# Patient Record
Sex: Male | Born: 1975 | Race: Black or African American | Hispanic: No | Marital: Single | Smoking: Former smoker
Health system: Southern US, Community
[De-identification: ages and names within clinical notes are randomized; demographics above are authoritative.]

---

## 2001-01-01 ENCOUNTER — Emergency Department (HOSPITAL_COMMUNITY): Admission: EM | Admit: 2001-01-01 | Discharge: 2001-01-01 | Payer: Self-pay | Admitting: Emergency Medicine

## 2001-09-05 ENCOUNTER — Emergency Department (HOSPITAL_COMMUNITY): Admission: EM | Admit: 2001-09-05 | Discharge: 2001-09-05 | Payer: Self-pay | Admitting: *Deleted

## 2001-09-27 ENCOUNTER — Emergency Department (HOSPITAL_COMMUNITY): Admission: EM | Admit: 2001-09-27 | Discharge: 2001-09-27 | Payer: Self-pay

## 2002-02-20 ENCOUNTER — Emergency Department (HOSPITAL_COMMUNITY): Admission: EM | Admit: 2002-02-20 | Discharge: 2002-02-20 | Payer: Self-pay | Admitting: Emergency Medicine

## 2002-02-23 ENCOUNTER — Emergency Department (HOSPITAL_COMMUNITY): Admission: EM | Admit: 2002-02-23 | Discharge: 2002-02-23 | Payer: Self-pay | Admitting: Emergency Medicine

## 2002-09-24 ENCOUNTER — Encounter: Payer: Self-pay | Admitting: Emergency Medicine

## 2002-09-24 ENCOUNTER — Emergency Department (HOSPITAL_COMMUNITY): Admission: EM | Admit: 2002-09-24 | Discharge: 2002-09-24 | Payer: Self-pay | Admitting: Emergency Medicine

## 2003-04-09 ENCOUNTER — Encounter: Payer: Self-pay | Admitting: Emergency Medicine

## 2003-04-09 ENCOUNTER — Emergency Department (HOSPITAL_COMMUNITY): Admission: EM | Admit: 2003-04-09 | Discharge: 2003-04-09 | Payer: Self-pay | Admitting: Emergency Medicine

## 2003-04-11 ENCOUNTER — Emergency Department (HOSPITAL_COMMUNITY): Admission: EM | Admit: 2003-04-11 | Discharge: 2003-04-11 | Payer: Self-pay | Admitting: Emergency Medicine

## 2003-07-10 ENCOUNTER — Emergency Department (HOSPITAL_COMMUNITY): Admission: EM | Admit: 2003-07-10 | Discharge: 2003-07-10 | Payer: Self-pay | Admitting: Emergency Medicine

## 2003-07-10 ENCOUNTER — Encounter: Payer: Self-pay | Admitting: Emergency Medicine

## 2003-11-15 ENCOUNTER — Emergency Department (HOSPITAL_COMMUNITY): Admission: EM | Admit: 2003-11-15 | Discharge: 2003-11-15 | Payer: Self-pay | Admitting: Emergency Medicine

## 2004-05-25 ENCOUNTER — Emergency Department (HOSPITAL_COMMUNITY): Admission: EM | Admit: 2004-05-25 | Discharge: 2004-05-25 | Payer: Self-pay | Admitting: Emergency Medicine

## 2004-07-06 ENCOUNTER — Emergency Department (HOSPITAL_COMMUNITY): Admission: EM | Admit: 2004-07-06 | Discharge: 2004-07-06 | Payer: Self-pay | Admitting: Emergency Medicine

## 2004-12-03 ENCOUNTER — Emergency Department (HOSPITAL_COMMUNITY): Admission: EM | Admit: 2004-12-03 | Discharge: 2004-12-03 | Payer: Self-pay | Admitting: Emergency Medicine

## 2004-12-16 ENCOUNTER — Emergency Department (HOSPITAL_COMMUNITY): Admission: EM | Admit: 2004-12-16 | Discharge: 2004-12-16 | Payer: Self-pay | Admitting: Emergency Medicine

## 2005-07-26 ENCOUNTER — Emergency Department (HOSPITAL_COMMUNITY): Admission: EM | Admit: 2005-07-26 | Discharge: 2005-07-26 | Payer: Self-pay | Admitting: Emergency Medicine

## 2005-08-20 ENCOUNTER — Emergency Department (HOSPITAL_COMMUNITY): Admission: EM | Admit: 2005-08-20 | Discharge: 2005-08-20 | Payer: Self-pay | Admitting: Emergency Medicine

## 2005-10-25 ENCOUNTER — Emergency Department (HOSPITAL_COMMUNITY): Admission: EM | Admit: 2005-10-25 | Discharge: 2005-10-25 | Payer: Self-pay | Admitting: Emergency Medicine

## 2005-11-10 ENCOUNTER — Emergency Department (HOSPITAL_COMMUNITY): Admission: EM | Admit: 2005-11-10 | Discharge: 2005-11-10 | Payer: Self-pay | Admitting: *Deleted

## 2005-12-07 ENCOUNTER — Emergency Department (HOSPITAL_COMMUNITY): Admission: EM | Admit: 2005-12-07 | Discharge: 2005-12-07 | Payer: Self-pay | Admitting: Emergency Medicine

## 2005-12-09 ENCOUNTER — Emergency Department (HOSPITAL_COMMUNITY): Admission: EM | Admit: 2005-12-09 | Discharge: 2005-12-09 | Payer: Self-pay | Admitting: Emergency Medicine

## 2005-12-14 ENCOUNTER — Emergency Department (HOSPITAL_COMMUNITY): Admission: EM | Admit: 2005-12-14 | Discharge: 2005-12-14 | Payer: Self-pay | Admitting: Emergency Medicine

## 2005-12-17 ENCOUNTER — Emergency Department (HOSPITAL_COMMUNITY): Admission: EM | Admit: 2005-12-17 | Discharge: 2005-12-17 | Payer: Self-pay | Admitting: Emergency Medicine

## 2006-01-05 ENCOUNTER — Emergency Department (HOSPITAL_COMMUNITY): Admission: EM | Admit: 2006-01-05 | Discharge: 2006-01-05 | Payer: Self-pay | Admitting: Emergency Medicine

## 2006-01-24 ENCOUNTER — Emergency Department (HOSPITAL_COMMUNITY): Admission: EM | Admit: 2006-01-24 | Discharge: 2006-01-24 | Payer: Self-pay | Admitting: Emergency Medicine

## 2006-02-14 ENCOUNTER — Emergency Department (HOSPITAL_COMMUNITY): Admission: EM | Admit: 2006-02-14 | Discharge: 2006-02-14 | Payer: Self-pay | Admitting: *Deleted

## 2006-02-28 ENCOUNTER — Emergency Department (HOSPITAL_COMMUNITY): Admission: EM | Admit: 2006-02-28 | Discharge: 2006-02-28 | Payer: Self-pay | Admitting: Emergency Medicine

## 2006-12-23 ENCOUNTER — Emergency Department (HOSPITAL_COMMUNITY): Admission: EM | Admit: 2006-12-23 | Discharge: 2006-12-23 | Payer: Self-pay | Admitting: Emergency Medicine

## 2007-11-20 ENCOUNTER — Emergency Department (HOSPITAL_COMMUNITY): Admission: EM | Admit: 2007-11-20 | Discharge: 2007-11-20 | Payer: Self-pay | Admitting: Emergency Medicine

## 2007-11-21 ENCOUNTER — Emergency Department (HOSPITAL_COMMUNITY): Admission: EM | Admit: 2007-11-21 | Discharge: 2007-11-21 | Payer: Self-pay | Admitting: Emergency Medicine

## 2008-09-26 ENCOUNTER — Emergency Department (HOSPITAL_BASED_OUTPATIENT_CLINIC_OR_DEPARTMENT_OTHER): Admission: EM | Admit: 2008-09-26 | Discharge: 2008-09-26 | Payer: Self-pay | Admitting: Emergency Medicine

## 2010-09-20 ENCOUNTER — Emergency Department (HOSPITAL_BASED_OUTPATIENT_CLINIC_OR_DEPARTMENT_OTHER): Admission: EM | Admit: 2010-09-20 | Discharge: 2010-09-21 | Payer: Self-pay | Admitting: Emergency Medicine

## 2011-11-30 ENCOUNTER — Encounter: Payer: Self-pay | Admitting: *Deleted

## 2011-11-30 ENCOUNTER — Emergency Department (HOSPITAL_BASED_OUTPATIENT_CLINIC_OR_DEPARTMENT_OTHER)
Admission: EM | Admit: 2011-11-30 | Discharge: 2011-11-30 | Disposition: A | Discharge status: Home / Self Care | Payer: PRIVATE HEALTH INSURANCE | Attending: Emergency Medicine | Admitting: Emergency Medicine

## 2011-11-30 DIAGNOSIS — Z79899 Other long term (current) drug therapy: Secondary | ICD-10-CM | POA: Insufficient documentation

## 2011-11-30 DIAGNOSIS — K5289 Other specified noninfective gastroenteritis and colitis: Secondary | ICD-10-CM | POA: Insufficient documentation

## 2011-11-30 DIAGNOSIS — K529 Noninfective gastroenteritis and colitis, unspecified: Secondary | ICD-10-CM

## 2011-11-30 DIAGNOSIS — R1110 Vomiting, unspecified: Secondary | ICD-10-CM | POA: Insufficient documentation

## 2011-11-30 MED ORDER — PROMETHAZINE HCL 25 MG PO TABS: 25.0000 mg | ORAL_TABLET | Freq: Four times a day (QID) | ORAL | Status: AC | PRN

## 2011-11-30 NOTE — ED Notes (Signed)
Fever, chills, body aches and vomiting since Monday.

## 2011-11-30 NOTE — ED Provider Notes (Signed)
History     CSN: [id number] Arrival date & time: 11/30/2011  9:23 PM   First MD Initiated Contact with Patient 11/30/11 2156      Chief Complaint  Patient presents with  . Influenza  . Emesis    (Consider location/radiation/quality/duration/timing/severity/associated sxs/prior treatment) HPI Comments: Started yesterday feeling badly.  Vomited twice.  Today having fevers, chills, and sweats.  No diarrhea.  No cough.  Patient is a 35 y.o. male presenting with flu symptoms and vomiting. The history is provided by the patient.  Influenza This is a new problem. The current episode started yesterday. The problem occurs constantly. The problem has been gradually worsening. Pertinent negatives include no chest pain, no abdominal pain, no headaches and no shortness of breath. The symptoms are aggravated by nothing. The symptoms are relieved by nothing.  Emesis  Pertinent negatives include no abdominal pain and no headaches.    History reviewed. No pertinent past medical history.  History reviewed. No pertinent past surgical history.  History reviewed. No pertinent family history.  History  Substance Use Topics  . Smoking status: Never Smoker   . Smokeless tobacco: Not on file  . Alcohol Use: No      Review of Systems  Respiratory: Negative for shortness of breath.   Cardiovascular: Negative for chest pain.  Gastrointestinal: Positive for vomiting. Negative for abdominal pain.  Neurological: Negative for headaches.  All other systems reviewed and are negative.    Allergies  Review of patient's allergies indicates no known allergies.  Home Medications   Current Outpatient Rx  Name Route Sig Dispense Refill  . BISMUTH SUBSALICYLATE 262 MG PO CHEW Oral Chew 524 mg by mouth as needed. For indigestion     . IBUPROFEN 800 MG PO TABS Oral Take 800 mg by mouth every 8 (eight) hours as needed. For pain     . ONE-DAILY MULTI VITAMINS PO TABS Oral Take 1 tablet by mouth daily.       Marland Kitchen NAPHAZOLINE HCL 0.012 % OP SOLN Both Eyes Place 2-3 drops into both eyes daily as needed. For dry eyes     . OVER THE COUNTER MEDICATION Oral Take 1 packet by mouth daily. Emergen-C powder     . PHENYLEPH-CPM-DM-APAP 04-13-09-325 MG PO CAPS Oral Take 2 tablets by mouth every 6 (six) hours as needed. For congestion       BP 173/95  Pulse 101  Temp(Src) 100.3 F (37.9 C) (Oral)  Resp 18  Ht 6\' 6"  (1.981 m)  Wt 230 lb (104.327 kg)  BMI 26.58 kg/m2  SpO2 97%  Physical Exam  Nursing note and vitals reviewed. Constitutional: He is oriented to person, place, and time. He appears well-developed and well-nourished.  HENT:  Head: Normocephalic and atraumatic.  Right Ear: External ear normal.  Left Ear: External ear normal.  Mouth/Throat: Oropharynx is clear and moist.  Neck: Normal range of motion. Neck supple.  Cardiovascular: Normal rate and regular rhythm.   No murmur heard. Pulmonary/Chest: Effort normal and breath sounds normal. No respiratory distress. He has no wheezes.  Abdominal: Soft. Bowel sounds are normal. He exhibits no distension. There is no tenderness.  Musculoskeletal: Normal range of motion. He exhibits no edema.  Lymphadenopathy:    He has no cervical adenopathy.  Neurological: He is alert and oriented to person, place, and time.  Skin: Skin is warm and dry.    ED Course  Procedures (including critical care time)  Labs Reviewed - No data to display No results  found.   No diagnosis found.    MDM  Vomiting, fever, likely viral etiology.  Will recommend tylenol, motrin, phenergan.        Geoffery Lyons, MD 11/30/11 2207

## 2011-12-02 ENCOUNTER — Emergency Department (HOSPITAL_BASED_OUTPATIENT_CLINIC_OR_DEPARTMENT_OTHER)
Admission: EM | Admit: 2011-12-02 | Discharge: 2011-12-02 | Disposition: A | Discharge status: Home / Self Care | Payer: PRIVATE HEALTH INSURANCE | Attending: Emergency Medicine | Admitting: Emergency Medicine

## 2011-12-02 ENCOUNTER — Encounter (HOSPITAL_BASED_OUTPATIENT_CLINIC_OR_DEPARTMENT_OTHER): Payer: Self-pay | Admitting: *Deleted

## 2011-12-02 DIAGNOSIS — R05 Cough: Secondary | ICD-10-CM | POA: Insufficient documentation

## 2011-12-02 DIAGNOSIS — B349 Viral infection, unspecified: Secondary | ICD-10-CM

## 2011-12-02 DIAGNOSIS — B9789 Other viral agents as the cause of diseases classified elsewhere: Secondary | ICD-10-CM | POA: Insufficient documentation

## 2011-12-02 DIAGNOSIS — R059 Cough, unspecified: Secondary | ICD-10-CM | POA: Insufficient documentation

## 2011-12-02 NOTE — ED Provider Notes (Signed)
History     CSN: [id number]  Arrival date & time 12/02/11  4098   First MD Initiated Contact with Patient 12/02/11 [phone]      Chief Complaint  Patient presents with  . Cough  . Nasal Congestion    (Consider location/radiation/quality/duration/timing/severity/associated sxs/prior treatment) HPI Patient with cough, fever, nausea and vomiting which began on Sunday with fever to 103.  Patient seen here on Monday and told he should stay out of work until today.  Patient states he still feels sick although fever vomiting have resolved  But congested and continues with productive cough.  Patient taking in fluids well.  No sob or lightheaded.  Patient does not smoke, drink, or take illicit drugs.   History reviewed. No pertinent past medical history.  History reviewed. No pertinent past surgical history.  No family history on file.  History  Substance Use Topics  . Smoking status: Never Smoker   . Smokeless tobacco: Not on file  . Alcohol Use: No      Review of Systems  All other systems reviewed and are negative.    Allergies  Review of patient's allergies indicates no known allergies.  Home Medications   Current Outpatient Rx  Name Route Sig Dispense Refill  . BISMUTH SUBSALICYLATE 262 MG PO CHEW Oral Chew 524 mg by mouth as needed. For indigestion     . IBUPROFEN 800 MG PO TABS Oral Take 800 mg by mouth every 8 (eight) hours as needed. For pain     . ONE-DAILY MULTI VITAMINS PO TABS Oral Take 1 tablet by mouth daily.      Marland Kitchen NAPHAZOLINE HCL 0.012 % OP SOLN Both Eyes Place 2-3 drops into both eyes daily as needed. For dry eyes     . OVER THE COUNTER MEDICATION Oral Take 1 packet by mouth daily. Emergen-C powder     . PHENYLEPH-CPM-DM-APAP 04-13-09-325 MG PO CAPS Oral Take 2 tablets by mouth every 6 (six) hours as needed. For congestion     . PROMETHAZINE HCL 25 MG PO TABS Oral Take 1 tablet (25 mg total) by mouth every 6 (six) hours as needed for nausea. 10 tablet 0    BP  138/81  Pulse 78  Temp(Src) 98.4 F (36.9 C) (Oral)  Resp 18  SpO2 100%  Physical Exam  Vitals reviewed. Constitutional: He is oriented to person, place, and time. He appears well-developed and well-nourished.  HENT:  Head: Normocephalic and atraumatic.  Right Ear: External ear normal.  Left Ear: External ear normal.  Nose: Nose normal.  Mouth/Throat: Oropharynx is clear and moist.  Eyes: Conjunctivae and EOM are normal. Pupils are equal, round, and reactive to light.  Neck: Normal range of motion. Neck supple.  Cardiovascular: Normal rate, regular rhythm, normal heart sounds and intact distal pulses.   Pulmonary/Chest: Effort normal and breath sounds normal.  Abdominal: Soft. Bowel sounds are normal.  Musculoskeletal: Normal range of motion.  Neurological: He is alert and oriented to person, place, and time. He has normal reflexes.  Skin: Skin is warm and dry.  Psychiatric: He has a normal mood and affect. His behavior is normal. Judgment and thought content normal.    ED Course  Procedures (including critical care time)  Labs Reviewed - No data to display No results found.   No diagnosis found.    Patient with improving viral syndrome symptoms but requests note to return tomorrow instead of today.  Patient counseled regarding expectations of time frame for recovery and to  return if worse.       Hilario Quarry, MD 12/02/11 [phone]

## 2011-12-02 NOTE — ED Notes (Signed)
Pt refuses gown

## 2011-12-02 NOTE — ED Notes (Signed)
Pt amb to room 5 with quick steady gait in nad. Pt reports ongoing cough and congestion x 2 days. Pt seen here on 12/18 for same.

## 2012-02-01 ENCOUNTER — Encounter (HOSPITAL_BASED_OUTPATIENT_CLINIC_OR_DEPARTMENT_OTHER): Payer: Self-pay | Admitting: *Deleted

## 2012-02-01 ENCOUNTER — Emergency Department (HOSPITAL_BASED_OUTPATIENT_CLINIC_OR_DEPARTMENT_OTHER)
Admission: EM | Admit: 2012-02-01 | Discharge: 2012-02-01 | Disposition: A | Discharge status: Home Health | Payer: PRIVATE HEALTH INSURANCE | Attending: Emergency Medicine | Admitting: Emergency Medicine

## 2012-02-01 DIAGNOSIS — R1110 Vomiting, unspecified: Secondary | ICD-10-CM | POA: Insufficient documentation

## 2012-02-01 DIAGNOSIS — J069 Acute upper respiratory infection, unspecified: Secondary | ICD-10-CM | POA: Insufficient documentation

## 2012-02-01 MED ORDER — SODIUM CHLORIDE 0.9 % IV BOLUS (SEPSIS)
1000.0000 mL | Freq: Once | INTRAVENOUS | Status: AC
  Administered 2012-02-01: 1000 mL via INTRAVENOUS

## 2012-02-01 MED ORDER — ONDANSETRON HCL 4 MG/2ML IJ SOLN
4.0000 mg | Freq: Once | INTRAMUSCULAR | Status: AC
  Administered 2012-02-01: 4 mg via INTRAVENOUS
  Filled 2012-02-01: qty 2

## 2012-02-01 NOTE — ED Notes (Signed)
States he feels better. Needs a work note before he leaves

## 2012-02-01 NOTE — ED Notes (Signed)
Chills, vomiting, sore throat, runny nose and cough.

## 2012-02-01 NOTE — Discharge Instructions (Signed)
Antibiotic Nonuse  Your caregiver felt that the infection or problem was not one that would be helped with an antibiotic. Infections may be caused by viruses or bacteria. Only a caregiver can tell which one of these is the likely cause of an illness. A cold is the most common cause of infection in both adults and children. A cold is a virus. Antibiotic treatment will have no effect on a viral infection. Viruses can lead to many lost days of work caring for sick children and many missed days of school. Children may catch as many as 10 "colds" or "flus" per year during which they can be tearful, cranky, and uncomfortable. The goal of treating a virus is aimed at keeping the ill person comfortable. Antibiotics are medications used to help the body fight bacterial infections. There are relatively few types of bacteria that cause infections but there are hundreds of viruses. While both viruses and bacteria cause infection they are very different types of germs. A viral infection will typically go away by itself within 7 to 10 days. Bacterial infections may spread or get worse without antibiotic treatment. Examples of bacterial infections are:  Sore throats (like strep throat or tonsillitis).     Infection in the lung (pneumonia).     Ear and skin infections.  Examples of viral infections are:  Colds or flus.     Most coughs and bronchitis.     Sore throats not caused by Strep.     Runny noses.  It is often best not to take an antibiotic when a viral infection is the cause of the problem. Antibiotics can kill off the helpful bacteria that we have inside our body and allow harmful bacteria to start growing. Antibiotics can cause side effects such as allergies, nausea, and diarrhea without helping to improve the symptoms of the viral infection. Additionally, repeated uses of antibiotics can cause bacteria inside of our body to become resistant. That resistance can be passed onto harmful bacterial. The  next time you have an infection it may be harder to treat if antibiotics are used when they are not needed. Not treating with antibiotics allows our own immune system to develop and take care of infections more efficiently. Also, antibiotics will work better for us when they are prescribed for bacterial infections. Treatments for a child that is ill may include:  Give extra fluids throughout the day to stay hydrated.     Get plenty of rest.     Only give your child over-the-counter or prescription medicines for pain, discomfort, or fever as directed by your caregiver.     The use of a cool mist humidifier may help stuffy noses.     Cold medications if suggested by your caregiver.  Your caregiver may decide to start you on an antibiotic if:  The problem you were seen for today continues for a longer length of time than expected.     You develop a secondary bacterial infection.  SEEK MEDICAL CARE IF:  Fever lasts longer than 5 days.     Symptoms continue to get worse after 5 to 7 days or become severe.     Difficulty in breathing develops.     Signs of dehydration develop (poor drinking, rare urinating, dark colored urine).     Changes in behavior or worsening tiredness (listlessness or lethargy).  Document Released: 02/07/2002 Document Revised: 08/11/2011 Document Reviewed: 08/06/2009 [employer] Patient Information 2012 [employer]. 

## 2012-02-01 NOTE — ED Provider Notes (Signed)
History     CSN: [id number]  Arrival date & time 02/01/12  1439   First MD Initiated Contact with Patient 02/01/12 1502      Chief Complaint  Patient presents with  . URI    (Consider location/radiation/quality/duration/timing/severity/associated sxs/prior treatment) Patient is a 36 y.o. male presenting with URI. The history is provided by the patient. No language interpreter was used.  URI The primary symptoms include sore throat, cough and vomiting. Primary symptoms do not include fever, nausea or myalgias. The current episode started 2 days ago. This is a new problem.    History reviewed. No pertinent past medical history.  History reviewed. No pertinent past surgical history.  No family history on file.  History  Substance Use Topics  . Smoking status: Never Smoker   . Smokeless tobacco: Not on file  . Alcohol Use: No      Review of Systems  Constitutional: Negative for fever.  HENT: Positive for sore throat.   Respiratory: Positive for cough.   Gastrointestinal: Positive for vomiting. Negative for nausea.  Musculoskeletal: Negative for myalgias.  All other systems reviewed and are negative.    Allergies  Review of patient's allergies indicates no known allergies.  Home Medications   Current Outpatient Rx  Name Route Sig Dispense Refill  . IBUPROFEN 800 MG PO TABS Oral Take 800 mg by mouth every 8 (eight) hours as needed. For pain     . ONE-DAILY MULTI VITAMINS PO TABS Oral Take 1 tablet by mouth daily.      Marland Kitchen NAPHAZOLINE HCL 0.012 % OP SOLN Both Eyes Place 2-3 drops into both eyes daily as needed. For dry eyes       BP 147/55  Pulse 64  Temp(Src) 98.2 F (36.8 C) (Oral)  Resp 18  SpO2 100%  Physical Exam  Nursing note and vitals reviewed. Constitutional: He is oriented to person, place, and time. He appears well-developed and well-nourished.  HENT:  Head: Normocephalic and atraumatic.  Right Ear: External ear normal.  Left Ear: External ear  normal.  Nose: Rhinorrhea present.  Mouth/Throat: Posterior oropharyngeal erythema present.  Eyes: EOM are normal.  Neck: Neck supple.  Cardiovascular: Normal rate and regular rhythm.   Pulmonary/Chest: Effort normal and breath sounds normal.  Abdominal: Soft. Bowel sounds are normal. There is no tenderness.  Neurological: He is alert and oriented to person, place, and time.  Skin: Skin is warm and dry.  Psychiatric: He has a normal mood and affect.    ED Course  Procedures (including critical care time)  Labs Reviewed - No data to display No results found.   1. URI (upper respiratory infection)       MDM  Pt feeling better at this time:pt states that he has nausea medication at home        Teressa Lower, NP 02/01/12 1630

## 2012-02-02 NOTE — ED Notes (Signed)
Patient came to the ED requesting a work note for one more day, due to continued fever.

## 2012-02-02 NOTE — ED Provider Notes (Signed)
Medical screening examination/treatment/procedure(s) were performed by non-physician practitioner and as supervising physician I was immediately available for consultation/collaboration.  Geoffery Lyons, MD 02/02/12 1213

## 2012-05-08 ENCOUNTER — Encounter (HOSPITAL_BASED_OUTPATIENT_CLINIC_OR_DEPARTMENT_OTHER): Payer: Self-pay | Admitting: Emergency Medicine

## 2012-05-08 ENCOUNTER — Emergency Department (HOSPITAL_BASED_OUTPATIENT_CLINIC_OR_DEPARTMENT_OTHER)
Admission: EM | Admit: 2012-05-08 | Discharge: 2012-05-08 | Disposition: A | Discharge status: Home / Self Care | Payer: PRIVATE HEALTH INSURANCE | Attending: Emergency Medicine | Admitting: Emergency Medicine

## 2012-05-08 DIAGNOSIS — K047 Periapical abscess without sinus: Secondary | ICD-10-CM

## 2012-05-08 MED ORDER — LIDOCAINE VISCOUS 2 % MT SOLN
20.0000 mL | Freq: Once | OROMUCOSAL | Status: AC
  Administered 2012-05-08: 15 mL via OROMUCOSAL

## 2012-05-08 MED ORDER — HYDROCODONE-ACETAMINOPHEN 5-325 MG PO TABS: ORAL_TABLET | ORAL | Status: AC

## 2012-05-08 MED ORDER — CLINDAMYCIN HCL 150 MG PO CAPS: 300.0000 mg | ORAL_CAPSULE | Freq: Three times a day (TID) | ORAL | Status: AC

## 2012-05-08 MED ORDER — LIDOCAINE VISCOUS 2 % MT SOLN
OROMUCOSAL | Status: AC
  Administered 2012-05-08: 15 mL via OROMUCOSAL
  Filled 2012-05-08: qty 15

## 2012-05-08 NOTE — Discharge Instructions (Signed)
Abscessed Tooth A tooth abscess is a collection of infected fluid (pus) from a bacterial infection in the inner part of the tooth (pulp). It usually occurs at the end of the tooth's root.  CAUSES   A very bad cavity (extensive tooth decay).   Trauma to the tooth, such as a broken or chipped tooth, that allows bacteria to enter into the pulp.  SYMPTOMS  Severe pain in and around the infected tooth.   Swelling and redness around the abscessed tooth or in the mouth or face.   Tenderness.   Pus drainage.   Bad breath.   Bitter taste in the mouth.   Difficulty swallowing.   Difficulty opening the mouth.   Feeling sick to your stomach (nauseous).   Vomiting.   Chills.   Swollen neck glands.  DIAGNOSIS  A medical and dental history will be taken.   An examination will be performed by tapping on the abscessed tooth.   X-rays may be taken of the tooth to identify the abscess.  TREATMENT The goal of treatment is to eliminate the infection.   You may be prescribed antibiotic medicine to stop the infection from spreading.   A root canal may be performed to save the tooth. If the tooth cannot be saved, it may be pulled (extracted) and the abscess may be drained.  HOME CARE INSTRUCTIONS  Only take over-the-counter or prescription medicines for pain, fever, or discomfort as directed by your caregiver.   Do not drive after taking pain medicine (narcotics).   Rinse your mouth (gargle) often with salt water ( tsp salt in 8 oz of warm water) to relieve pain or swelling.   Do not apply heat to the outside of your face.   Return to your dentist for further treatment as directed.  SEEK IMMEDIATE DENTAL CARE IF:  You have a temperature by mouth above 102 F (38.9 C), not controlled by medicine.   You have chills or a very bad headache.   You have problems breathing or swallowing.   Your have trouble opening your mouth.   You develop swelling in the neck or around the eye.    Your pain is not helped by medicine.   Your pain is getting worse instead of better.  Document Released: 11/29/2005 Document Revised: 11/18/2011 Document Reviewed: 03/09/2011 [facility] Patient Information 2012 [place], [place].     Narcotic and benzodiazepine use may cause drowsiness, slowed breathing or dependence.  Please use with caution and do not drive, operate machinery or watch young children alone while taking them.  Taking combinations of these medications or drinking alcohol will potentiate these effects.

## 2012-05-08 NOTE — ED Provider Notes (Signed)
History     CSN: [id number]  Arrival date & time 05/08/12  4098   First MD Initiated Contact with Patient 05/08/12 [phone]      Chief Complaint  Patient presents with  . Dental Problem    (Consider location/radiation/quality/duration/timing/severity/associated sxs/prior treatment) HPI Comments: Pt has worse pain and swelling around left lower tooth.  He thinks it was chipped partially before, got loosened when brushing his teeth, then on Saturday while chewing, felt more of the tooth break off.  No fevers, no difficulty breathing or swallowing.  Pt has taken some ibuprofen with no sig relief.  No swollen lymph glands.    The history is provided by the patient.    History reviewed. No pertinent past medical history.  History reviewed. No pertinent past surgical history.  History reviewed. No pertinent family history.  History  Substance Use Topics  . Smoking status: Never Smoker   . Smokeless tobacco: Not on file  . Alcohol Use: No      Review of Systems  Constitutional: Negative for fever, chills and appetite change.  HENT: Positive for dental problem. Negative for sore throat, mouth sores and trouble swallowing.   Respiratory: Negative for shortness of breath.   Skin: Negative for rash.  Hematological: Negative for adenopathy.    Allergies  Review of patient's allergies indicates no known allergies.  Home Medications   Current Outpatient Rx  Name Route Sig Dispense Refill  . CLINDAMYCIN HCL 150 MG PO CAPS Oral Take 2 capsules (300 mg total) by mouth 3 (three) times daily. 21 capsule 0  . HYDROCODONE-ACETAMINOPHEN 5-325 MG PO TABS  1-2 tablets po q 6 hours prn moderate to severe pain 20 tablet 0  . IBUPROFEN 800 MG PO TABS Oral Take 800 mg by mouth every 8 (eight) hours as needed. For pain     . ONE-DAILY MULTI VITAMINS PO TABS Oral Take 1 tablet by mouth daily.      Marland Kitchen NAPHAZOLINE HCL 0.012 % OP SOLN Both Eyes Place 2-3 drops into both eyes daily as needed. For dry  eyes       BP 143/77  Pulse 66  Temp(Src) 98.2 F (36.8 C) (Oral)  Resp 16  SpO2 99%  Physical Exam  Nursing note and vitals reviewed. Constitutional: He is oriented to person, place, and time. He appears well-developed and well-nourished. No distress.  HENT:  Head: Normocephalic and atraumatic. No trismus in the jaw.  Mouth/Throat: Oropharynx is clear and moist and mucous membranes are normal. Abnormal dentition. Dental abscesses and dental caries present. No uvula swelling.    Lymphadenopathy:       Head (left side): Submandibular adenopathy present. No submental, no tonsillar, no preauricular, no posterior auricular and no occipital adenopathy present.  Neurological: He is alert and oriented to person, place, and time.    ED Course  INCISION AND DRAINAGE Date/Time: 05/08/2012 10:08 AM Performed by: Lear Ng. Authorized by: Lear Ng Consent: Verbal consent obtained. Risks and benefits: risks, benefits and alternatives were discussed Consent given by: patient Patient understanding: patient states understanding of the procedure being performed Patient consent: the patient's understanding of the procedure matches consent given Patient identity confirmed: verbally with patient and arm band Time out: Immediately prior to procedure a "time out" was called to verify the correct patient, procedure, equipment, support staff and site/side marked as required. Type: abscess Body area: mouth Local anesthetic: lidocaine 2% without epinephrine and topical anesthetic Anesthetic total: 2 ml Patient sedated: no Scalpel  size: 11 Incision type: single straight Complexity: simple Drainage: purulent Drainage amount: copious Wound treatment: wound left open Patient tolerance: Patient tolerated the procedure well with no immediate complications. Comments: Abscess to gumline of left lower premolar   (including critical care time)  Labs Reviewed - No data to display No  results found.   1. Dental abscess       MDM  Will need analgesic, abx and referral to oral surgeon for tooth extraction.  This is discussed with pt.          Gavin Pound. Shenequa Howse, MD 05/08/12 1011

## 2012-05-08 NOTE — ED Notes (Signed)
Pt sts he chipped a tooth on Sat, now c/o gum swelling and pain

## 2012-12-05 ENCOUNTER — Emergency Department (HOSPITAL_BASED_OUTPATIENT_CLINIC_OR_DEPARTMENT_OTHER): Payer: PRIVATE HEALTH INSURANCE

## 2012-12-05 ENCOUNTER — Emergency Department (HOSPITAL_BASED_OUTPATIENT_CLINIC_OR_DEPARTMENT_OTHER)
Admission: EM | Admit: 2012-12-05 | Discharge: 2012-12-05 | Disposition: A | Discharge status: Home / Self Care | Payer: PRIVATE HEALTH INSURANCE | Attending: Emergency Medicine | Admitting: Emergency Medicine

## 2012-12-05 ENCOUNTER — Encounter (HOSPITAL_BASED_OUTPATIENT_CLINIC_OR_DEPARTMENT_OTHER): Payer: Self-pay | Admitting: *Deleted

## 2012-12-05 DIAGNOSIS — R072 Precordial pain: Secondary | ICD-10-CM | POA: Insufficient documentation

## 2012-12-05 DIAGNOSIS — R079 Chest pain, unspecified: Secondary | ICD-10-CM

## 2012-12-05 LAB — CBC WITH DIFFERENTIAL/PLATELET
Basophils Absolute: 0 10*3/uL (ref 0.0–0.1)
Eosinophils Absolute: 0.2 10*3/uL (ref 0.0–0.7)
Eosinophils Relative: 2 % (ref 0–5)
HCT: 43.5 % (ref 39.0–52.0)
Lymphocytes Relative: 26 % (ref 12–46)
MCH: 26.8 pg (ref 26.0–34.0)
MCHC: 34 g/dL (ref 30.0–36.0)
MCV: 78.7 fL (ref 78.0–100.0)
Monocytes Absolute: 0.6 10*3/uL (ref 0.1–1.0)
Neutro Abs: 6.4 10*3/uL (ref 1.7–7.7)

## 2012-12-05 LAB — BASIC METABOLIC PANEL
BUN: 9 mg/dL (ref 6–23)
CO2: 29 mEq/L (ref 19–32)
Chloride: 106 mEq/L (ref 96–112)
Creatinine, Ser: 1.2 mg/dL (ref 0.50–1.35)
GFR calc Af Amer: 89 mL/min — ABNORMAL LOW (ref >90)
Glucose, Bld: 95 mg/dL (ref 70–99)

## 2012-12-05 MED ORDER — IBUPROFEN 800 MG PO TABS
800.0000 mg | ORAL_TABLET | Freq: Once | ORAL | Status: AC
  Administered 2012-12-05: 800 mg via ORAL
  Filled 2012-12-05: qty 1

## 2012-12-05 MED ORDER — IBUPROFEN 600 MG PO TABS: 600.0000 mg | ORAL_TABLET | Freq: Four times a day (QID) | ORAL | Status: DC | PRN

## 2012-12-05 NOTE — ED Notes (Signed)
States he feels better and is ready to go home. 

## 2012-12-05 NOTE — ED Notes (Signed)
ekg performed while pt being triaged 

## 2012-12-05 NOTE — ED Provider Notes (Addendum)
History     CSN: [id number]  Arrival date & time 12/05/12  6962   First MD Initiated Contact with Patient 12/05/12 1136      Chief Complaint  Patient presents with  . Chest Pain    (Consider location/radiation/quality/duration/timing/severity/associated sxs/prior treatment) HPI Comments: Patient presents with sharp chest pain to the left side of his chest that started at 10 AM this morning. He states that it was a sharp stabbing pain that lasts a few seconds and sensation and aching subluxate his chest. He says it's worse with movement of his left arm in a certain positions. It's a little bit worse when he takes a deep breath and aching feeling normally takes a deep breath. He denies any shortness of breath. He denies any leg pain or swelling. He denies any cough or chest congestion. He denies any nausea vomiting or diaphoresis.  Patient is a 36 y.o. male presenting with chest pain.  Chest Pain Pertinent negatives for primary symptoms include no fever, no fatigue, no shortness of breath, no cough, no abdominal pain, no nausea, no vomiting and no dizziness.  Pertinent negatives for associated symptoms include no diaphoresis, no numbness and no weakness.     History reviewed. No pertinent past medical history.  History reviewed. No pertinent past surgical history.  History reviewed. No pertinent family history.  History  Substance Use Topics  . Smoking status: Never Smoker   . Smokeless tobacco: Not on file  . Alcohol Use: No      Review of Systems  Constitutional: Negative for fever, chills, diaphoresis and fatigue.  HENT: Negative for congestion, rhinorrhea and sneezing.   Eyes: Negative.   Respiratory: Negative for cough, chest tightness and shortness of breath.   Cardiovascular: Positive for chest pain. Negative for leg swelling.  Gastrointestinal: Negative for nausea, vomiting, abdominal pain, diarrhea and blood in stool.  Genitourinary: Negative for frequency,  hematuria, flank pain and difficulty urinating.  Musculoskeletal: Negative for back pain and arthralgias.  Skin: Negative for rash.  Neurological: Negative for dizziness, speech difficulty, weakness, numbness and headaches.    Allergies  Review of patient's allergies indicates no known allergies.  Home Medications   Current Outpatient Rx  Name  Route  Sig  Dispense  Refill  . IBUPROFEN 600 MG PO TABS   Oral   Take 1 tablet (600 mg total) by mouth every 6 (six) hours as needed for pain.   30 tablet   0   . IBUPROFEN 800 MG PO TABS   Oral   Take 800 mg by mouth every 8 (eight) hours as needed. For pain          . ONE-DAILY MULTI VITAMINS PO TABS   Oral   Take 1 tablet by mouth daily.           Marland Kitchen NAPHAZOLINE HCL 0.012 % OP SOLN   Both Eyes   Place 2-3 drops into both eyes daily as needed. For dry eyes            BP 131/69  Pulse 53  Temp 97.9 F (36.6 C) (Oral)  Resp 17  SpO2 100%  Physical Exam  Constitutional: He is oriented to person, place, and time. He appears well-developed and well-nourished.  HENT:  Head: Normocephalic and atraumatic.  Eyes: Pupils are equal, round, and reactive to light.  Neck: Normal range of motion. Neck supple.  Cardiovascular: Normal rate, regular rhythm and normal heart sounds.   Pulmonary/Chest: Effort normal and breath sounds normal.  No respiratory distress. He has no wheezes. He has no rales. He exhibits tenderness (positive mild tenderness to the left parasternal area).  Abdominal: Soft. Bowel sounds are normal. There is no tenderness. There is no rebound and no guarding.  Musculoskeletal: Normal range of motion. He exhibits no edema and no tenderness.       Negative Homans sign  Lymphadenopathy:    He has no cervical adenopathy.  Neurological: He is alert and oriented to person, place, and time.  Skin: Skin is warm and dry. No rash noted.  Psychiatric: He has a normal mood and affect.    ED Course  Procedures (including  critical care time)  Results for orders placed during the hospital encounter of 12/05/12  CBC WITH DIFFERENTIAL      Component Value Range   WBC 9.7  4.0 - 10.5 K/uL   RBC 5.53  4.22 - 5.81 MIL/uL   Hemoglobin 14.8  13.0 - 17.0 g/dL   HCT 84.6  96.2 - 95.2 %   MCV 78.7  78.0 - 100.0 fL   MCH 26.8  26.0 - 34.0 pg   MCHC 34.0  30.0 - 36.0 g/dL   RDW 84.1 (*) 32.4 - 40.1 %   Platelets 193  150 - 400 K/uL   Neutrophils Relative 66  43 - 77 %   Neutro Abs 6.4  1.7 - 7.7 K/uL   Lymphocytes Relative 26  12 - 46 %   Lymphs Abs 2.5  0.7 - 4.0 K/uL   Monocytes Relative 7  3 - 12 %   Monocytes Absolute 0.6  0.1 - 1.0 K/uL   Eosinophils Relative 2  0 - 5 %   Eosinophils Absolute 0.2  0.0 - 0.7 K/uL   Basophils Relative 0  0 - 1 %   Basophils Absolute 0.0  0.0 - 0.1 K/uL  BASIC METABOLIC PANEL      Component Value Range   Sodium 142  135 - 145 mEq/L   Potassium 4.4  3.5 - 5.1 mEq/L   Chloride 106  96 - 112 mEq/L   CO2 29  19 - 32 mEq/L   Glucose, Bld 95  70 - 99 mg/dL   BUN 9  6 - 23 mg/dL   Creatinine, Ser 0.27  0.50 - 1.35 mg/dL   Calcium 9.8  8.4 - 25.3 mg/dL   GFR calc non Af Amer 77 (*) >90 mL/min   GFR calc Af Amer 89 (*) >90 mL/min  TROPONIN I      Component Value Range   Troponin I <0.30  <0.30 ng/mL  D-DIMER, QUANTITATIVE      Component Value Range   D-Dimer, Quant <0.27  0.00 - 0.48 ug/mL-FEU   Dg Chest 2 View  12/05/2012  *RADIOLOGY REPORT*  Clinical Data: Chest pain and hypertension  CHEST - 2 VIEW  Comparison:  November 10, 2005  Findings: Lungs clear.  Heart size and pulmonary vascularity are normal.  No adenopathy.  No bone lesions.  IMPRESSION: No abnormality noted.   Original Report Authenticated By: Bretta Bang, M.D.      Date: 12/05/2012  Rate: 55  Rhythm: sinus bradycardia  QRS Axis: normal  Intervals: normal  ST/T Wave abnormalities: normal  Conduction Disutrbances:none  Narrative Interpretation:   Old EKG Reviewed: none available   Dg Chest 2  View  12/05/2012  *RADIOLOGY REPORT*  Clinical Data: Chest pain and hypertension  CHEST - 2 VIEW  Comparison:  November 10, 2005  Findings: Lungs  clear.  Heart size and pulmonary vascularity are normal.  No adenopathy.  No bone lesions.  IMPRESSION: No abnormality noted.   Original Report Authenticated By: Bretta Bang, M.D.    Results for orders placed during the hospital encounter of 12/05/12  CBC WITH DIFFERENTIAL      Component Value Range   WBC 9.7  4.0 - 10.5 K/uL   RBC 5.53  4.22 - 5.81 MIL/uL   Hemoglobin 14.8  13.0 - 17.0 g/dL   HCT 16.1  09.6 - 04.5 %   MCV 78.7  78.0 - 100.0 fL   MCH 26.8  26.0 - 34.0 pg   MCHC 34.0  30.0 - 36.0 g/dL   RDW 40.9 (*) 81.1 - 91.4 %   Platelets 193  150 - 400 K/uL   Neutrophils Relative 66  43 - 77 %   Neutro Abs 6.4  1.7 - 7.7 K/uL   Lymphocytes Relative 26  12 - 46 %   Lymphs Abs 2.5  0.7 - 4.0 K/uL   Monocytes Relative 7  3 - 12 %   Monocytes Absolute 0.6  0.1 - 1.0 K/uL   Eosinophils Relative 2  0 - 5 %   Eosinophils Absolute 0.2  0.0 - 0.7 K/uL   Basophils Relative 0  0 - 1 %   Basophils Absolute 0.0  0.0 - 0.1 K/uL  BASIC METABOLIC PANEL      Component Value Range   Sodium 142  135 - 145 mEq/L   Potassium 4.4  3.5 - 5.1 mEq/L   Chloride 106  96 - 112 mEq/L   CO2 29  19 - 32 mEq/L   Glucose, Bld 95  70 - 99 mg/dL   BUN 9  6 - 23 mg/dL   Creatinine, Ser 7.82  0.50 - 1.35 mg/dL   Calcium 9.8  8.4 - 95.6 mg/dL   GFR calc non Af Amer 77 (*) >90 mL/min   GFR calc Af Amer 89 (*) >90 mL/min  TROPONIN I      Component Value Range   Troponin I <0.30  <0.30 ng/mL  D-DIMER, QUANTITATIVE      Component Value Range   D-Dimer, Quant <0.27  0.00 - 0.48 ug/mL-FEU   Dg Chest 2 View  12/05/2012  *RADIOLOGY REPORT*  Clinical Data: Chest pain and hypertension  CHEST - 2 VIEW  Comparison:  November 10, 2005  Findings: Lungs clear.  Heart size and pulmonary vascularity are normal.  No adenopathy.  No bone lesions.  IMPRESSION: No abnormality  noted.   Original Report Authenticated By: Bretta Bang, M.D.       1. Chest pain       MDM  Patient's pain is much improved after Motrin. It is reproducible and worse with movement. I feel that this is musculoskeletal in origin. He has nothing else suggestive of pulmonary embolus. Have a low suspicion of acute coronary syndrome. There is no evidence of pneumothorax or pneumonia. I did give him prescription for ibuprofen to use for the pain and advised to followup with her primary care physician return here if his symptoms are not improving or if he has any worsening symptoms.        Rolan Bucco, MD 12/05/12 1258  Rolan Bucco, MD 12/05/12 1259

## 2012-12-05 NOTE — ED Notes (Signed)
Pt amb to room 2 with quick steady gait in nad. Pt reports sudden onset of mid and left sided "sharp, cramping" pain in his chest that has since resolved. Pt states he only has the pain when he bends over, or moves a certain way. Left chest is tender to palp,  Pt denies any sob or other c/o.

## 2013-05-21 ENCOUNTER — Encounter (HOSPITAL_BASED_OUTPATIENT_CLINIC_OR_DEPARTMENT_OTHER): Payer: Self-pay

## 2013-05-21 ENCOUNTER — Emergency Department (HOSPITAL_BASED_OUTPATIENT_CLINIC_OR_DEPARTMENT_OTHER)
Admission: EM | Admit: 2013-05-21 | Discharge: 2013-05-21 | Disposition: A | Discharge status: Home / Self Care | Payer: PRIVATE HEALTH INSURANCE | Attending: Emergency Medicine | Admitting: Emergency Medicine

## 2013-05-21 ENCOUNTER — Emergency Department (HOSPITAL_BASED_OUTPATIENT_CLINIC_OR_DEPARTMENT_OTHER): Payer: PRIVATE HEALTH INSURANCE

## 2013-05-21 DIAGNOSIS — S29011A Strain of muscle and tendon of front wall of thorax, initial encounter: Secondary | ICD-10-CM

## 2013-05-21 DIAGNOSIS — X503XXA Overexertion from repetitive movements, initial encounter: Secondary | ICD-10-CM | POA: Insufficient documentation

## 2013-05-21 DIAGNOSIS — F17200 Nicotine dependence, unspecified, uncomplicated: Secondary | ICD-10-CM | POA: Insufficient documentation

## 2013-05-21 DIAGNOSIS — S2341XA Sprain of ribs, initial encounter: Secondary | ICD-10-CM | POA: Insufficient documentation

## 2013-05-21 DIAGNOSIS — Y9389 Activity, other specified: Secondary | ICD-10-CM | POA: Insufficient documentation

## 2013-05-21 DIAGNOSIS — Y929 Unspecified place or not applicable: Secondary | ICD-10-CM | POA: Insufficient documentation

## 2013-05-21 MED ORDER — IBUPROFEN 600 MG PO TABS: 600.0000 mg | ORAL_TABLET | Freq: Four times a day (QID) | ORAL | Status: DC | PRN

## 2013-05-21 NOTE — ED Notes (Signed)
Patient transported to X-ray 

## 2013-05-21 NOTE — ED Provider Notes (Addendum)
History     CSN: [id number]  Arrival date & time 05/21/13  4098   First MD Initiated Contact with Patient 05/21/13 [phone]      Chief Complaint  Patient presents with  . Chest Pain    (Consider location/radiation/quality/duration/timing/severity/associated sxs/prior treatment) Patient is a 37 y.o. male presenting with chest pain. The history is provided by the patient.  Chest Pain Pain location:  L chest Pain quality: sharp and stabbing   Pain radiates to:  Does not radiate Pain radiates to the back: no   Pain severity:  Moderate Onset quality:  Sudden (started after stretching out his arms last night) Timing:  Constant Progression:  Unchanged Chronicity:  New Context: breathing, lifting, movement and raising an arm   Relieved by:  Nothing Worsened by:  Deep breathing, coughing and movement Associated symptoms: no abdominal pain, no cough, no nausea, no palpitations, no shortness of breath, not vomiting and no weakness   Risk factors: smoking   Risk factors: no coronary artery disease, no diabetes mellitus, no hypertension, no immobilization and no prior DVT/PE     History reviewed. No pertinent past medical history.  History reviewed. No pertinent past surgical history.  No family history on file.  History  Substance Use Topics  . Smoking status: Current Every Day Smoker    Types: Cigarettes  . Smokeless tobacco: Not on file  . Alcohol Use: No      Review of Systems  Respiratory: Negative for cough and shortness of breath.   Cardiovascular: Positive for chest pain. Negative for palpitations.  Gastrointestinal: Negative for nausea, vomiting and abdominal pain.  Neurological: Negative for weakness.  All other systems reviewed and are negative.    Allergies  Review of patient's allergies indicates no known allergies.  Home Medications   Current Outpatient Rx  Name  Route  Sig  Dispense  Refill  . Multiple Vitamin (MULTIVITAMIN) tablet   Oral   Take 1  tablet by mouth daily.           . naphazoline (CLEAR EYES) 0.012 % ophthalmic solution   Both Eyes   Place 2-3 drops into both eyes daily as needed. For dry eyes            BP 145/95  Pulse 56  Resp 16  Ht 6\' 8"  (2.032 m)  Wt 250 lb (113.399 kg)  BMI 27.46 kg/m2  SpO2 100%  Physical Exam  Nursing note and vitals reviewed. Constitutional: He is oriented to person, place, and time. He appears well-developed and well-nourished. No distress.  HENT:  Head: Normocephalic and atraumatic.  Mouth/Throat: Oropharynx is clear and moist.  Eyes: Conjunctivae and EOM are normal. Pupils are equal, round, and reactive to light.  Neck: Normal range of motion. Neck supple.  Cardiovascular: Normal rate, regular rhythm and intact distal pulses.   No murmur heard. Pulmonary/Chest: Effort normal and breath sounds normal. No respiratory distress. He has no wheezes. He has no rales. He exhibits tenderness. He exhibits no bony tenderness and no crepitus.    Abdominal: Soft. He exhibits no distension. There is no tenderness. There is no rebound and no guarding.  Musculoskeletal: Normal range of motion. He exhibits no edema and no tenderness.  Neurological: He is alert and oriented to person, place, and time.  Skin: Skin is warm and dry. No rash noted. No erythema.  Psychiatric: He has a normal mood and affect. His behavior is normal.    ED Course  Procedures (including critical care time)  Labs Reviewed - No data to display Dg Chest 2 View  05/21/2013   *RADIOLOGY REPORT*  Clinical Data: Chest pain  CHEST - 2 VIEW  Comparison: 02/06/2012  Findings: Heart size is normal.  No pleural effusion or edema.  No airspace consolidation identified.  Review of the visualized osseous structures is unremarkable.  IMPRESSION:  1.  No acute cardiopulmonary abnormalities.   Original Report Authenticated By: Signa Kell, M.D.    Date: 05/21/2013  Rate: 65  Rhythm: normal sinus rhythm  QRS Axis: normal   Intervals: normal  ST/T Wave abnormalities: normal  Conduction Disutrbances: none  Narrative Interpretation: unremarkable      1. Pectoralis muscle strain, initial encounter       MDM   Patient was stretching last night and developed pain in the left side of his chest. It is painful with movement of the arms, movement of the body and deep breathing. On exam he has pain along his pectoralis muscle with a normal EKG and normal chest x-ray. He has no medical issues or risk factors for cardiac disease or PE. He has perc negative.  Dilatation most likely pulled his pectoralis muscle. Given ibuprofen and rest        Gwyneth Sprout, MD 05/21/13 1610  Gwyneth Sprout, MD 05/21/13 [phone]

## 2013-05-21 NOTE — ED Notes (Signed)
Pt rpeorts left chest wall pain described as sharp.  Onset yesterday.  Pain is associated with taking a deep breath.

## 2014-06-29 ENCOUNTER — Emergency Department (HOSPITAL_COMMUNITY)
Admission: EM | Admit: 2014-06-29 | Discharge: 2014-06-29 | Disposition: A | Discharge status: Home / Self Care | Payer: PRIVATE HEALTH INSURANCE | Attending: Emergency Medicine | Admitting: Emergency Medicine

## 2014-06-29 ENCOUNTER — Emergency Department (HOSPITAL_COMMUNITY): Payer: PRIVATE HEALTH INSURANCE

## 2014-06-29 ENCOUNTER — Encounter (HOSPITAL_COMMUNITY): Payer: Self-pay | Admitting: Emergency Medicine

## 2014-06-29 DIAGNOSIS — Y92410 Unspecified street and highway as the place of occurrence of the external cause: Secondary | ICD-10-CM | POA: Diagnosis not present

## 2014-06-29 DIAGNOSIS — S199XXA Unspecified injury of neck, initial encounter: Principal | ICD-10-CM

## 2014-06-29 DIAGNOSIS — Z791 Long term (current) use of non-steroidal anti-inflammatories (NSAID): Secondary | ICD-10-CM | POA: Insufficient documentation

## 2014-06-29 DIAGNOSIS — Y9389 Activity, other specified: Secondary | ICD-10-CM | POA: Insufficient documentation

## 2014-06-29 DIAGNOSIS — F17200 Nicotine dependence, unspecified, uncomplicated: Secondary | ICD-10-CM | POA: Insufficient documentation

## 2014-06-29 DIAGNOSIS — S0993XA Unspecified injury of face, initial encounter: Secondary | ICD-10-CM | POA: Diagnosis not present

## 2014-06-29 DIAGNOSIS — S060X1A Concussion with loss of consciousness of 30 minutes or less, initial encounter: Secondary | ICD-10-CM | POA: Diagnosis present

## 2014-06-29 DIAGNOSIS — Z79899 Other long term (current) drug therapy: Secondary | ICD-10-CM | POA: Insufficient documentation

## 2014-06-29 MED ORDER — ACETAMINOPHEN 325 MG PO TABS
650.0000 mg | ORAL_TABLET | Freq: Once | ORAL | Status: AC
  Administered 2014-06-29: 650 mg via ORAL
  Filled 2014-06-29: qty 2

## 2014-06-29 MED ORDER — METHOCARBAMOL 750 MG PO TABS: 750.0000 mg | ORAL_TABLET | Freq: Four times a day (QID) | ORAL | Status: DC

## 2014-06-29 MED ORDER — OXYCODONE-ACETAMINOPHEN 5-325 MG PO TABS: 2.0000 | ORAL_TABLET | ORAL | Status: DC | PRN
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
## 2014-06-29 NOTE — ED Notes (Signed)
Patient transported to CT 
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
## 2014-07-01 ENCOUNTER — Emergency Department (HOSPITAL_BASED_OUTPATIENT_CLINIC_OR_DEPARTMENT_OTHER)
Admission: EM | Admit: 2014-07-01 | Discharge: 2014-07-01 | Disposition: A | Discharge status: Home / Self Care | Payer: PRIVATE HEALTH INSURANCE | Attending: Emergency Medicine | Admitting: Emergency Medicine

## 2014-07-01 ENCOUNTER — Encounter (HOSPITAL_BASED_OUTPATIENT_CLINIC_OR_DEPARTMENT_OTHER): Payer: Self-pay | Admitting: Emergency Medicine

## 2014-07-01 ENCOUNTER — Emergency Department (HOSPITAL_BASED_OUTPATIENT_CLINIC_OR_DEPARTMENT_OTHER): Payer: PRIVATE HEALTH INSURANCE

## 2014-07-01 DIAGNOSIS — M545 Low back pain, unspecified: Secondary | ICD-10-CM | POA: Diagnosis present

## 2014-07-01 DIAGNOSIS — R42 Dizziness and giddiness: Secondary | ICD-10-CM | POA: Diagnosis not present

## 2014-07-01 DIAGNOSIS — G8911 Acute pain due to trauma: Secondary | ICD-10-CM | POA: Diagnosis not present

## 2014-07-01 DIAGNOSIS — Z87891 Personal history of nicotine dependence: Secondary | ICD-10-CM | POA: Diagnosis not present

## 2014-07-01 DIAGNOSIS — Z79899 Other long term (current) drug therapy: Secondary | ICD-10-CM | POA: Insufficient documentation
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
## 2014-07-01 NOTE — Discharge Instructions (Signed)

## 2014-07-01 NOTE — ED Provider Notes (Signed)
History/physical exam/procedure(s) were performed by non-physician practitioner and as supervising physician I was immediately available for consultation/collaboration. I have reviewed all notes and am in agreement with care and plan.   Emireth Cockerham S Hilario Quarryay, MD 07/01/14 [phone]

## 2014-07-02 ENCOUNTER — Ambulatory Visit (INDEPENDENT_AMBULATORY_CARE_PROVIDER_SITE_OTHER): Payer: PRIVATE HEALTH INSURANCE | Admitting: Family Medicine

## 2014-07-02 ENCOUNTER — Encounter: Payer: Self-pay | Admitting: Family Medicine

## 2014-07-02 VITALS — BP 105/67 | HR 53 | Ht >= 80 in | Wt 260.0 lb

## 2014-07-02 DIAGNOSIS — M545 Low back pain, unspecified: Secondary | ICD-10-CM

## 2014-07-02 DIAGNOSIS — IMO0002 Reserved for concepts with insufficient information to code with codable children: Secondary | ICD-10-CM

## 2014-07-02 DIAGNOSIS — S3992XA Unspecified injury of lower back, initial encounter: Secondary | ICD-10-CM

## 2014-07-02 MED ORDER — OXYCODONE-ACETAMINOPHEN 5-325 MG PO TABS: 1.0000 | ORAL_TABLET | Freq: Four times a day (QID) | ORAL | Status: DC | PRN

## 2014-07-02 MED ORDER — METHOCARBAMOL 750 MG PO TABS: 750.0000 mg | ORAL_TABLET | Freq: Four times a day (QID) | ORAL | Status: DC | PRN

## 2014-07-02 NOTE — Patient Instructions (Addendum)
You have a severe lumbar strain. Consider prednisone 6 day dose pack. Take ibuprofen 600-800mg  three times a day with food. Percocet as needed for severe pain (no driving on this medicine). Robaxin as needed for muscle spasms (no driving on this medicine if it makes you sleepy). Stay as active as possible. Physical therapy has been shown to be helpful as well - call if you want to start this. Strengthening of low back muscles, abdominal musculature are key for long term pain relief. If not improving, will consider further imaging (MRI). Follow up with me in 1 month.

## 2014-07-04 ENCOUNTER — Encounter: Payer: Self-pay | Admitting: Family Medicine

## 2014-07-04 DIAGNOSIS — S3992XA Unspecified injury of lower back, initial encounter: Secondary | ICD-10-CM | POA: Insufficient documentation
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
## 2014-07-05 ENCOUNTER — Telehealth: Payer: Self-pay | Admitting: Family Medicine

## 2014-07-05 ENCOUNTER — Encounter: Payer: Self-pay | Admitting: Family Medicine

## 2014-07-05 NOTE — Telephone Encounter (Signed)
I don't think he needs to be out completely starting next week - we could put him on restrictions to ease back into working and see him back in a month if he would like.

## 2014-07-08 ENCOUNTER — Encounter: Payer: Self-pay | Admitting: Family Medicine

## 2014-07-08 ENCOUNTER — Ambulatory Visit (INDEPENDENT_AMBULATORY_CARE_PROVIDER_SITE_OTHER): Payer: PRIVATE HEALTH INSURANCE | Admitting: Family Medicine

## 2014-07-08 VITALS — BP 126/78 | HR 61 | Ht >= 80 in | Wt 260.0 lb

## 2014-07-08 DIAGNOSIS — S3992XA Unspecified injury of lower back, initial encounter: Secondary | ICD-10-CM

## 2014-07-08 DIAGNOSIS — IMO0002 Reserved for concepts with insufficient information to code with codable children: Secondary | ICD-10-CM

## 2014-07-10 ENCOUNTER — Encounter: Payer: Self-pay | Admitting: Family Medicine

## 2014-07-10 ENCOUNTER — Encounter: Payer: PRIVATE HEALTH INSURANCE | Admitting: Family Medicine
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
## 2014-07-11 NOTE — Progress Notes (Signed)
This encounter was created in error - please disregard.

## 2014-07-14 ENCOUNTER — Ambulatory Visit
Admission: RE | Admit: 2014-07-14 | Discharge: 2014-07-14 | Disposition: A | Discharge status: Home / Self Care | Payer: No Typology Code available for payment source | Source: Ambulatory Visit | Attending: Family Medicine | Admitting: Family Medicine

## 2014-07-14 DIAGNOSIS — S3992XA Unspecified injury of lower back, initial encounter: Secondary | ICD-10-CM

## 2014-07-15 ENCOUNTER — Encounter: Payer: Self-pay | Admitting: Family Medicine

## 2014-07-16 ENCOUNTER — Other Ambulatory Visit: Payer: Self-pay | Admitting: *Deleted

## 2014-07-16 DIAGNOSIS — S39012A Strain of muscle, fascia and tendon of lower back, initial encounter: Secondary | ICD-10-CM

## 2014-07-19 ENCOUNTER — Encounter: Payer: Self-pay | Admitting: Family Medicine

## 2014-07-19 ENCOUNTER — Ambulatory Visit (INDEPENDENT_AMBULATORY_CARE_PROVIDER_SITE_OTHER): Payer: Self-pay | Admitting: Family Medicine

## 2014-07-19 VITALS — BP 122/76 | HR 59 | Ht >= 80 in | Wt 260.0 lb

## 2014-07-19 DIAGNOSIS — M654 Radial styloid tenosynovitis [de Quervain]: Secondary | ICD-10-CM

## 2014-07-19 NOTE — Patient Instructions (Signed)
You have deQuervain's tenosynovitis, a degeneration and scar tissue formation within the tendons that go into your thumb. Avoid painful activities as much as possible. Wear the thumb spica brace as often as possible to rest this including at work. Ice 15 minutes at a time 3-4 times a day Ibuprofen 600 or 800mg  three times a day with food for pain and inflammation. A cortisone injection typically helps a great deal with this and is an option if you're not improving. Physical therapy for iontophoresis is another consideration instead of an injection. You can repeat up to 2 injections but if these still do not help, surgical referral is indicated. Follow up with me in 5-6 weeks for this issue.

## 2014-07-22 ENCOUNTER — Encounter: Payer: Self-pay | Admitting: Family Medicine

## 2014-07-22 ENCOUNTER — Ambulatory Visit: Payer: No Typology Code available for payment source | Attending: Family Medicine | Admitting: Rehabilitation

## 2014-07-22 DIAGNOSIS — M545 Low back pain, unspecified: Secondary | ICD-10-CM | POA: Insufficient documentation

## 2014-07-22 DIAGNOSIS — M654 Radial styloid tenosynovitis [de Quervain]: Secondary | ICD-10-CM | POA: Insufficient documentation

## 2014-07-22 DIAGNOSIS — IMO0001 Reserved for inherently not codable concepts without codable children: Secondary | ICD-10-CM | POA: Insufficient documentation

## 2014-07-22 NOTE — Assessment & Plan Note (Signed)
discussed options.  He will start with thumb spica brace, icing, nsaids.  Consider injection if not improving.  F/u in 5-6 weeks for this issue.
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
## 2014-07-29 ENCOUNTER — Ambulatory Visit: Payer: No Typology Code available for payment source | Admitting: Rehabilitation

## 2014-07-29 DIAGNOSIS — M545 Low back pain, unspecified: Secondary | ICD-10-CM | POA: Diagnosis not present

## 2014-07-29 DIAGNOSIS — IMO0001 Reserved for inherently not codable concepts without codable children: Secondary | ICD-10-CM | POA: Diagnosis present

## 2014-07-31 ENCOUNTER — Ambulatory Visit: Payer: No Typology Code available for payment source | Admitting: Rehabilitation

## 2014-07-31 DIAGNOSIS — IMO0001 Reserved for inherently not codable concepts without codable children: Secondary | ICD-10-CM | POA: Diagnosis not present

## 2014-08-02 ENCOUNTER — Encounter: Payer: Self-pay | Admitting: Family Medicine

## 2014-08-02 ENCOUNTER — Ambulatory Visit (INDEPENDENT_AMBULATORY_CARE_PROVIDER_SITE_OTHER): Payer: Self-pay | Admitting: Family Medicine

## 2014-08-02 VITALS — BP 143/83 | Ht >= 80 in | Wt 260.0 lb

## 2014-08-02 DIAGNOSIS — Z5189 Encounter for other specified aftercare: Secondary | ICD-10-CM

## 2014-08-02 DIAGNOSIS — IMO0002 Reserved for concepts with insufficient information to code with codable children: Secondary | ICD-10-CM

## 2014-08-02 DIAGNOSIS — S3992XD Unspecified injury of lower back, subsequent encounter: Secondary | ICD-10-CM

## 2014-08-02 NOTE — Patient Instructions (Signed)
Continue using the brace for your wrist. Continue with physical therapy and home exercises for your back. Follow up with me in 4 weeks for both issues.

## 2014-08-05 ENCOUNTER — Encounter: Payer: Self-pay | Admitting: Family Medicine

## 2014-08-05 ENCOUNTER — Ambulatory Visit: Payer: No Typology Code available for payment source | Admitting: Rehabilitation

## 2014-08-05 DIAGNOSIS — IMO0001 Reserved for inherently not codable concepts without codable children: Secondary | ICD-10-CM | POA: Diagnosis not present

## 2014-08-06 ENCOUNTER — Encounter: Payer: Self-pay | Admitting: Family Medicine
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
## 2014-08-07 ENCOUNTER — Ambulatory Visit: Payer: No Typology Code available for payment source | Admitting: Rehabilitation

## 2014-08-07 DIAGNOSIS — IMO0001 Reserved for inherently not codable concepts without codable children: Secondary | ICD-10-CM | POA: Diagnosis not present

## 2014-08-12 ENCOUNTER — Ambulatory Visit: Payer: No Typology Code available for payment source

## 2014-08-12 DIAGNOSIS — IMO0001 Reserved for inherently not codable concepts without codable children: Secondary | ICD-10-CM | POA: Diagnosis not present

## 2014-08-20 ENCOUNTER — Ambulatory Visit: Payer: No Typology Code available for payment source | Attending: Family Medicine | Admitting: Physical Therapy

## 2014-08-20 DIAGNOSIS — M545 Low back pain, unspecified: Secondary | ICD-10-CM | POA: Insufficient documentation

## 2014-08-20 DIAGNOSIS — IMO0001 Reserved for inherently not codable concepts without codable children: Secondary | ICD-10-CM | POA: Insufficient documentation

## 2014-08-26 ENCOUNTER — Ambulatory Visit: Payer: No Typology Code available for payment source | Admitting: Physical Therapy

## 2014-08-26 DIAGNOSIS — M545 Low back pain, unspecified: Secondary | ICD-10-CM | POA: Diagnosis not present

## 2014-08-26 DIAGNOSIS — IMO0001 Reserved for inherently not codable concepts without codable children: Secondary | ICD-10-CM | POA: Diagnosis present

## 2014-08-27 ENCOUNTER — Ambulatory Visit (INDEPENDENT_AMBULATORY_CARE_PROVIDER_SITE_OTHER): Payer: Self-pay | Admitting: Family Medicine

## 2014-08-27 ENCOUNTER — Encounter: Payer: Self-pay | Admitting: Family Medicine

## 2014-08-27 VITALS — BP 133/78 | HR 73 | Ht >= 80 in | Wt 250.0 lb

## 2014-08-27 DIAGNOSIS — IMO0002 Reserved for concepts with insufficient information to code with codable children: Secondary | ICD-10-CM

## 2014-08-27 DIAGNOSIS — S3992XD Unspecified injury of lower back, subsequent encounter: Secondary | ICD-10-CM

## 2014-08-27 DIAGNOSIS — Z5189 Encounter for other specified aftercare: Secondary | ICD-10-CM

## 2014-08-27 DIAGNOSIS — M654 Radial styloid tenosynovitis [de Quervain]: Secondary | ICD-10-CM

## 2014-08-27 NOTE — Patient Instructions (Addendum)
Call [facility] for an evaluation with Riki Rusk or his colleague [phone]). Continue with home exercises. Extend current work restrictions an additional 6 weeks. Follow up with me in 6 weeks.

## 2014-08-28 ENCOUNTER — Ambulatory Visit: Payer: No Typology Code available for payment source | Admitting: Rehabilitation

## 2014-08-29 ENCOUNTER — Encounter: Payer: Self-pay | Admitting: Family Medicine

## 2014-08-29 NOTE — Assessment & Plan Note (Signed)
still tender but not currently wearing brace and not interested at this time in injection - will let us know if this is the case.
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
## 2014-08-30 ENCOUNTER — Ambulatory Visit: Payer: PRIVATE HEALTH INSURANCE | Admitting: Family Medicine

## 2014-09-03 ENCOUNTER — Ambulatory Visit: Payer: No Typology Code available for payment source | Admitting: Rehabilitation

## 2014-09-03 DIAGNOSIS — IMO0001 Reserved for inherently not codable concepts without codable children: Secondary | ICD-10-CM | POA: Diagnosis not present

## 2014-09-10 ENCOUNTER — Ambulatory Visit: Payer: No Typology Code available for payment source | Admitting: Physical Therapy

## 2014-09-10 DIAGNOSIS — IMO0001 Reserved for inherently not codable concepts without codable children: Secondary | ICD-10-CM | POA: Diagnosis not present

## 2014-09-17 ENCOUNTER — Ambulatory Visit: Payer: No Typology Code available for payment source | Admitting: Rehabilitation

## 2014-09-18 ENCOUNTER — Ambulatory Visit: Payer: No Typology Code available for payment source | Admitting: Physical Therapy

## 2014-10-08 ENCOUNTER — Ambulatory Visit (INDEPENDENT_AMBULATORY_CARE_PROVIDER_SITE_OTHER): Payer: PRIVATE HEALTH INSURANCE | Admitting: Family Medicine

## 2014-10-08 ENCOUNTER — Ambulatory Visit: Payer: PRIVATE HEALTH INSURANCE | Admitting: Family Medicine

## 2014-10-08 ENCOUNTER — Encounter: Payer: Self-pay | Admitting: Family Medicine

## 2014-10-08 VITALS — BP 147/86 | HR 62 | Ht >= 80 in | Wt 260.0 lb

## 2014-10-08 DIAGNOSIS — S3992XD Unspecified injury of lower back, subsequent encounter: Secondary | ICD-10-CM

## 2014-10-08 DIAGNOSIS — M654 Radial styloid tenosynovitis [de Quervain]: Secondary | ICD-10-CM

## 2014-10-08 MED ORDER — METHYLPREDNISOLONE ACETATE 40 MG/ML IJ SUSP
40.0000 mg | Freq: Once | INTRAMUSCULAR | Status: AC
  Administered 2014-10-08: 40 mg via INTRA_ARTICULAR

## 2014-10-08 NOTE — Patient Instructions (Signed)
Continue wearing the wrist brace for roughly another 6 weeks. Icing still 15 minutes at a time 3-4 times a day. Continue current work restrictions for 6 weeks - we will likely talk about maximum medical improvement at your next visit then.

## 2014-10-10 ENCOUNTER — Encounter: Payer: Self-pay | Admitting: Family Medicine

## 2014-10-10 NOTE — Assessment & Plan Note (Signed)
using thumb spica but not improving.  Given injection today.  Icing regularly.  F/u in 6 weeks.  After informed written consent patient was seated on exam table.  Area overlying right 1st dorsal compartment prepped with alcohol swab then injected with 1:1 marcaine: depomedrol.  Patient tolerated procedure well without immediate complications.
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
## 2014-11-19 ENCOUNTER — Ambulatory Visit (INDEPENDENT_AMBULATORY_CARE_PROVIDER_SITE_OTHER): Payer: No Typology Code available for payment source | Admitting: Family Medicine

## 2014-11-19 ENCOUNTER — Encounter: Payer: Self-pay | Admitting: Family Medicine

## 2014-11-19 VITALS — BP 133/92 | HR 76 | Ht >= 80 in | Wt 260.0 lb

## 2014-11-19 DIAGNOSIS — M654 Radial styloid tenosynovitis [de Quervain]: Secondary | ICD-10-CM

## 2014-11-19 DIAGNOSIS — S3992XD Unspecified injury of lower back, subsequent encounter: Secondary | ICD-10-CM

## 2014-11-20 ENCOUNTER — Telehealth: Payer: Self-pay | Admitting: Family Medicine

## 2014-11-20 NOTE — Telephone Encounter (Signed)
New letter printed, signed, and placed up front.
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
## 2014-11-21 NOTE — Assessment & Plan Note (Signed)
Much improved following injection and use of brace.  F/u in 6 weeks.
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
## 2014-11-26 ENCOUNTER — Telehealth: Payer: Self-pay | Admitting: Family Medicine

## 2014-12-30 ENCOUNTER — Encounter: Payer: Self-pay | Admitting: Family Medicine

## 2014-12-30 ENCOUNTER — Ambulatory Visit (INDEPENDENT_AMBULATORY_CARE_PROVIDER_SITE_OTHER): Payer: Self-pay | Admitting: Family Medicine

## 2014-12-30 VITALS — BP 122/73 | HR 53 | Ht >= 80 in | Wt 255.0 lb

## 2014-12-30 DIAGNOSIS — S3992XD Unspecified injury of lower back, subsequent encounter: Secondary | ICD-10-CM

## 2014-12-30 DIAGNOSIS — M654 Radial styloid tenosynovitis [de Quervain]: Secondary | ICD-10-CM

## 2014-12-31 NOTE — Assessment & Plan Note (Signed)
Much improved following injection and use of brace.  F/u prn.
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
## 2015-01-01 ENCOUNTER — Ambulatory Visit: Payer: No Typology Code available for payment source | Admitting: Family Medicine

## 2015-02-04 ENCOUNTER — Emergency Department (HOSPITAL_BASED_OUTPATIENT_CLINIC_OR_DEPARTMENT_OTHER)
Admission: EM | Admit: 2015-02-04 | Discharge: 2015-02-04 | Disposition: A | Discharge status: Home / Self Care | Payer: Self-pay | Attending: Emergency Medicine | Admitting: Emergency Medicine

## 2015-02-04 ENCOUNTER — Encounter (HOSPITAL_BASED_OUTPATIENT_CLINIC_OR_DEPARTMENT_OTHER): Payer: Self-pay

## 2015-02-04 DIAGNOSIS — R197 Diarrhea, unspecified: Secondary | ICD-10-CM | POA: Insufficient documentation

## 2015-02-04 DIAGNOSIS — R1013 Epigastric pain: Secondary | ICD-10-CM | POA: Insufficient documentation

## 2015-02-04 DIAGNOSIS — R112 Nausea with vomiting, unspecified: Secondary | ICD-10-CM | POA: Insufficient documentation

## 2015-02-04 DIAGNOSIS — Z87891 Personal history of nicotine dependence: Secondary | ICD-10-CM | POA: Insufficient documentation

## 2015-02-04 LAB — COMPREHENSIVE METABOLIC PANEL
ALK PHOS: 72 U/L (ref 39–117)
ALT: 39 U/L (ref 0–53)
AST: 71 U/L — ABNORMAL HIGH (ref 0–37)
Albumin: 4.2 g/dL (ref 3.5–5.2)
Anion gap: 4 — ABNORMAL LOW (ref 5–15)
BUN: 11 mg/dL (ref 6–23)
CO2: 28 mmol/L (ref 19–32)
Calcium: 8.9 mg/dL (ref 8.4–10.5)
Chloride: 107 mmol/L (ref 96–112)
Creatinine, Ser: 1.06 mg/dL (ref 0.50–1.35)
GFR calc Af Amer: >90 mL/min (ref >90)
GFR, EST NON AFRICAN AMERICAN: 88 mL/min — AB (ref >90)
GLUCOSE: 96 mg/dL (ref 70–99)
POTASSIUM: 3.8 mmol/L (ref 3.5–5.1)
Sodium: 139 mmol/L (ref 135–145)
Total Bilirubin: 0.8 mg/dL (ref 0.3–1.2)
Total Protein: 7.2 g/dL (ref 6.0–8.3)

## 2015-02-04 LAB — CBC
HCT: 41.4 % (ref 39.0–52.0)
Hemoglobin: 14 g/dL (ref 13.0–17.0)
MCH: 26.8 pg (ref 26.0–34.0)
MCHC: 33.8 g/dL (ref 30.0–36.0)
MCV: 79.2 fL (ref 78.0–100.0)
PLATELETS: 182 10*3/uL (ref 150–400)
RBC: 5.23 MIL/uL (ref 4.22–5.81)
RDW: 15.2 % (ref 11.5–15.5)
WBC: 8.2 10*3/uL (ref 4.0–10.5)

## 2015-02-04 LAB — LIPASE, BLOOD: Lipase: 30 U/L (ref 11–59)

## 2015-02-04 MED ORDER — PANTOPRAZOLE SODIUM 20 MG PO TBEC: 20.0000 mg | DELAYED_RELEASE_TABLET | Freq: Every day | ORAL | Status: DC

## 2015-02-04 MED ORDER — PANTOPRAZOLE SODIUM 40 MG IV SOLR
40.0000 mg | Freq: Once | INTRAVENOUS | Status: AC
  Administered 2015-02-04: 40 mg via INTRAVENOUS
  Filled 2015-02-04: qty 40

## 2015-02-04 MED ORDER — ONDANSETRON HCL 4 MG PO TABS: 4.0000 mg | ORAL_TABLET | Freq: Four times a day (QID) | ORAL | Status: DC

## 2015-02-04 MED ORDER — ONDANSETRON HCL 4 MG/2ML IJ SOLN
4.0000 mg | Freq: Once | INTRAMUSCULAR | Status: AC
  Administered 2015-02-04: 4 mg via INTRAVENOUS
  Filled 2015-02-04: qty 2

## 2015-02-04 MED ORDER — SODIUM CHLORIDE 0.9 % IV BOLUS (SEPSIS)
1000.0000 mL | Freq: Once | INTRAVENOUS | Status: AC
  Administered 2015-02-04: 1000 mL via INTRAVENOUS

## 2015-02-04 NOTE — ED Notes (Signed)
Pt given ginger ale at this time.  

## 2015-02-04 NOTE — ED Notes (Signed)
C/o abd pain, n/v/d x 2 days

## 2015-02-04 NOTE — Discharge Instructions (Signed)
Take zofran as directed as needed for nausea. Take protonix daily.  Diarrhea Diarrhea is frequent loose and watery bowel movements. It can cause you to feel weak and dehydrated. Dehydration can cause you to become tired and thirsty, have a dry mouth, and have decreased urination that often is dark yellow. Diarrhea is a sign of another problem, most often an infection that will not last long. In most cases, diarrhea typically lasts 2-3 days. However, it can last longer if it is a sign of something more serious. It is important to treat your diarrhea as directed by your caregiver to lessen or prevent future episodes of diarrhea. CAUSES  Some common causes include:  Gastrointestinal infections caused by viruses, bacteria, or parasites.  Food poisoning or food allergies.  Certain medicines, such as antibiotics, chemotherapy, and laxatives.  Artificial sweeteners and fructose.  Digestive disorders. HOME CARE INSTRUCTIONS  Ensure adequate fluid intake (hydration): Have 1 cup (8 oz) of fluid for each diarrhea episode. Avoid fluids that contain simple sugars or sports drinks, fruit juices, whole milk products, and sodas. Your urine should be clear or pale yellow if you are drinking enough fluids. Hydrate with an oral rehydration solution that you can purchase at pharmacies, retail stores, and online. You can prepare an oral rehydration solution at home by mixing the following ingredients together:   - tsp table salt.   tsp baking soda.   tsp salt substitute containing potassium chloride.  1  tablespoons sugar.  1 L (34 oz) of water.  Certain foods and beverages may increase the speed at which food moves through the gastrointestinal (GI) tract. These foods and beverages should be avoided and include:  Caffeinated and alcoholic beverages.  High-fiber foods, such as raw fruits and vegetables, nuts, seeds, and whole grain breads and cereals.  Foods and beverages sweetened with sugar  alcohols, such as xylitol, sorbitol, and mannitol.  Some foods may be well tolerated and may help thicken stool including:  Starchy foods, such as rice, toast, pasta, low-sugar cereal, oatmeal, grits, baked potatoes, crackers, and bagels.  Bananas.  Applesauce.  Add probiotic-rich foods to help increase healthy bacteria in the GI tract, such as yogurt and fermented milk products.  Wash your hands well after each diarrhea episode.  Only take over-the-counter or prescription medicines as directed by your caregiver.  Take a warm bath to relieve any burning or pain from frequent diarrhea episodes. SEEK IMMEDIATE MEDICAL CARE IF:   You are unable to keep fluids down.  You have persistent vomiting.  You have blood in your stool, or your stools are black and tarry.  You do not urinate in 6-8 hours, or there is only a small amount of very dark urine.  You have abdominal pain that increases or localizes.  You have weakness, dizziness, confusion, or light-headedness.  You have a severe headache.  Your diarrhea gets worse or does not get better.  You have a fever or persistent symptoms for more than 2-3 days.  You have a fever and your symptoms suddenly get worse. MAKE SURE YOU:   Understand these instructions.  Will watch your condition.  Will get help right away if you are not doing well or get worse. Document Released: 11/19/2002 Document Revised: 04/15/2014 Document Reviewed: 08/06/2012 [facility] Patient Information 2015 [place], [place]. This information is not intended to replace advice given to you by your health care provider. Make sure you discuss any questions you have with your health care provider.  Food Choices to Help  Relieve Diarrhea When you have diarrhea, the foods you eat and your eating habits are very important. Choosing the right foods and drinks can help relieve diarrhea. Also, because diarrhea can last up to 7 days, you need to replace lost fluids and  electrolytes (such as sodium, potassium, and chloride) in order to help prevent dehydration.  WHAT GENERAL GUIDELINES DO I NEED TO FOLLOW?  Slowly drink 1 cup (8 oz) of fluid for each episode of diarrhea. If you are getting enough fluid, your urine will be clear or pale yellow.  Eat starchy foods. Some good choices include white rice, white toast, pasta, low-fiber cereal, baked potatoes (without the skin), saltine crackers, and bagels.  Avoid large servings of any cooked vegetables.  Limit fruit to two servings per day. A serving is  cup or 1 small piece.  Choose foods with less than 2 g of fiber per serving.  Limit fats to less than 8 tsp (38 g) per day.  Avoid fried foods.  Eat foods that have probiotics in them. Probiotics can be found in certain dairy products.  Avoid foods and beverages that may increase the speed at which food moves through the stomach and intestines (gastrointestinal tract). Things to avoid include:  High-fiber foods, such as dried fruit, raw fruits and vegetables, nuts, seeds, and whole grain foods.  Spicy foods and high-fat foods.  Foods and beverages sweetened with high-fructose corn syrup, honey, or sugar alcohols such as xylitol, sorbitol, and mannitol. WHAT FOODS ARE RECOMMENDED? Grains White rice. White, [place], or pita breads (fresh or toasted), including plain rolls, buns, or bagels. White pasta. Saltine, soda, or graham crackers. Pretzels. Low-fiber cereal. Cooked cereals made with water (such as cornmeal, farina, or cream cereals). Plain muffins. Matzo. Melba toast. Zwieback.  Vegetables Potatoes (without the skin). Strained tomato and vegetable juices. Most well-cooked and canned vegetables without seeds. Tender lettuce. Fruits Cooked or canned applesauce, apricots, cherries, fruit cocktail, grapefruit, peaches, pears, or plums. Fresh bananas, apples without skin, cherries, grapes, cantaloupe, grapefruit, peaches, oranges, or plums.  Meat and  Other Protein Products Baked or boiled chicken. Eggs. Tofu. Fish. Seafood. Smooth peanut butter. Ground or well-cooked tender beef, ham, veal, lamb, pork, or poultry.  Dairy Plain yogurt, kefir, and unsweetened liquid yogurt. Lactose-free milk, buttermilk, or soy milk. Plain hard cheese. Beverages Sport drinks. Clear broths. Diluted fruit juices (except prune). Regular, caffeine-free sodas such as ginger ale. Water. Decaffeinated teas. Oral rehydration solutions. Sugar-free beverages not sweetened with sugar alcohols. Other Bouillon, broth, or soups made from recommended foods.  The items listed above may not be a complete list of recommended foods or beverages. Contact your dietitian for more options. WHAT FOODS ARE NOT RECOMMENDED? Grains Whole grain, whole wheat, bran, or rye breads, rolls, pastas, crackers, and cereals. Wild or brown rice. Cereals that contain more than 2 g of fiber per serving. Corn tortillas or taco shells. Cooked or dry oatmeal. Granola. Popcorn. Vegetables Raw vegetables. Cabbage, broccoli, Brussels sprouts, artichokes, baked beans, beet greens, corn, kale, legumes, peas, sweet potatoes, and yams. Potato skins. Cooked spinach and cabbage. Fruits Dried fruit, including raisins and dates. Raw fruits. Stewed or dried prunes. Fresh apples with skin, apricots, mangoes, pears, raspberries, and strawberries.  Meat and Other Protein Products Chunky peanut butter. Nuts and seeds. Beans and lentils. Tomasa Blase.  Dairy High-fat cheeses. Milk, chocolate milk, and beverages made with milk, such as milk shakes. Cream. Ice cream. Sweets and Desserts Sweet rolls, doughnuts, and sweet breads. Pancakes and waffles. Fats  and Oils Butter. Cream sauces. Margarine. Salad oils. Plain salad dressings. Olives. Avocados.  Beverages Caffeinated beverages (such as coffee, tea, soda, or energy drinks). Alcoholic beverages. Fruit juices with pulp. Prune juice. Soft drinks sweetened with high-fructose  corn syrup or sugar alcohols. Other Coconut. Hot sauce. Chili powder. Mayonnaise. Gravy. Cream-based or milk-based soups.  The items listed above may not be a complete list of foods and beverages to avoid. Contact your dietitian for more information. WHAT SHOULD I DO IF I BECOME DEHYDRATED? Diarrhea can sometimes lead to dehydration. Signs of dehydration include dark urine and dry mouth and skin. If you think you are dehydrated, you should rehydrate with an oral rehydration solution. These solutions can be purchased at pharmacies, retail stores, or online.  Drink -1 cup (120-240 mL) of oral rehydration solution each time you have an episode of diarrhea. If drinking this amount makes your diarrhea worse, try drinking smaller amounts more often. For example, drink 1-3 tsp (5-15 mL) every 5-10 minutes.  A general rule for staying hydrated is to drink 1-2 L of fluid per day. Talk to your health care provider about the specific amount you should be drinking each day. Drink enough fluids to keep your urine clear or pale yellow. Document Released: 02/19/2004 Document Revised: 12/04/2013 Document Reviewed: 10/22/2013 [facility] Patient Information 2015 [place], [place]. This information is not intended to replace advice given to you by your health care provider. Make sure you discuss any questions you have with your health care provider.  Nausea and Vomiting Nausea means you feel sick to your stomach. Throwing up (vomiting) is a reflex where stomach contents come out of your mouth. HOME CARE   Take medicine as told by your doctor.  Do not force yourself to eat. However, you do need to drink fluids.  If you feel like eating, eat a normal diet as told by your doctor.  Eat rice, wheat, potatoes, bread, lean meats, yogurt, fruits, and vegetables.  Avoid high-fat foods.  Drink enough fluids to keep your pee (urine) clear or pale yellow.  Ask your doctor how to replace body fluid losses (rehydrate).  Signs of body fluid loss (dehydration) include:  Feeling very thirsty.  Dry lips and mouth.  Feeling dizzy.  Dark pee.  Peeing less than normal.  Feeling confused.  Fast breathing or heart rate. GET HELP RIGHT AWAY IF:   You have blood in your throw up.  You have black or bloody poop (stool).  You have a bad headache or stiff neck.  You feel confused.  You have bad belly (abdominal) pain.  You have chest pain or trouble breathing.  You do not pee at least once every 8 hours.  You have cold, clammy skin.  You keep throwing up after 24 to 48 hours.  You have a fever. MAKE SURE YOU:   Understand these instructions.  Will watch your condition.  Will get help right away if you are not doing well or get worse. Document Released: 05/17/2008 Document Revised: 02/21/2012 Document Reviewed: 04/30/2011 [facility] Patient Information 2015 [place], [place]. This information is not intended to replace advice given to you by your health care provider. Make sure you discuss any questions you have with your health care provider.  Abdominal Pain Many things can cause abdominal pain. Usually, abdominal pain is not caused by a disease and will improve without treatment. It can often be observed and treated at home. Your health care provider will do a physical exam and possibly order blood  tests and X-rays to help determine the seriousness of your pain. However, in many cases, more time must pass before a clear cause of the pain can be found. Before that point, your health care provider may not know if you need more testing or further treatment. HOME CARE INSTRUCTIONS  Monitor your abdominal pain for any changes. The following actions may help to alleviate any discomfort you are experiencing:  Only take over-the-counter or prescription medicines as directed by your health care provider.  Do not take laxatives unless directed to do so by your health care provider.  Try a clear liquid  diet (broth, tea, or water) as directed by your health care provider. Slowly move to a bland diet as tolerated. SEEK MEDICAL CARE IF:  You have unexplained abdominal pain.  You have abdominal pain associated with nausea or diarrhea.  You have pain when you urinate or have a bowel movement.  You experience abdominal pain that wakes you in the night.  You have abdominal pain that is worsened or improved by eating food.  You have abdominal pain that is worsened with eating fatty foods.  You have a fever. SEEK IMMEDIATE MEDICAL CARE IF:   Your pain does not go away within 2 hours.  You keep throwing up (vomiting).  Your pain is felt only in portions of the abdomen, such as the right side or the left lower portion of the abdomen.  You pass bloody or black tarry stools. MAKE SURE YOU:  Understand these instructions.   Will watch your condition.   Will get help right away if you are not doing well or get worse.  Document Released: 09/08/2005 Document Revised: 12/04/2013 Document Reviewed: 08/08/2013 [facility] Patient Information 2015 [place], [place]. This information is not intended to replace advice given to you by your health care provider. Make sure you discuss any questions you have with your health care provider.

## 2015-02-04 NOTE — ED Provider Notes (Signed)
CSN: [id number]     Arrival date & time 02/04/15  1321 History   First MD Initiated Contact with Patient 02/04/15 1330     Chief Complaint  Patient presents with  . Abdominal Pain     (Consider location/radiation/quality/duration/timing/severity/associated sxs/prior Treatment) HPI Comments: 39 year old male presenting with abdominal pain, nausea, vomiting and diarrhea 2 days. Pain is in the epigastric area, nonradiating, 6/10, no aggravating or alleviating factors. States he has been unable to keep anything down over the past 2 days and had multiple episodes of nonbloody, nonbilious emesis. He reports 2 episodes of nonbloody diarrhea today. The day before his symptoms began, he had [place] food. No sick contacts, recent travel or recent antibiotic use. Denies fever or chills. Denies alcohol use.  Patient is a 39 y.o. male presenting with abdominal pain. The history is provided by the patient.  Abdominal Pain Associated symptoms: diarrhea, nausea and vomiting     History reviewed. No pertinent past medical history. History reviewed. No pertinent past surgical history. No family history on file. History  Substance Use Topics  . Smoking status: Former Smoker    Quit date: 06/12/2012  . Smokeless tobacco: Not on file  . Alcohol Use: No    Review of Systems  Gastrointestinal: Positive for nausea, vomiting, abdominal pain and diarrhea.  All other systems reviewed and are negative.     Allergies  Review of patient's allergies indicates no known allergies.  Home Medications   Prior to Admission medications   Medication Sig Start Date End Date Taking? Authorizing Provider  ondansetron (ZOFRAN) 4 MG tablet Take 1 tablet (4 mg total) by mouth every 6 (six) hours. 02/04/15   Mckenna Boruff M Amya Hlad, PA-C  pantoprazole (PROTONIX) 20 MG tablet Take 1 tablet (20 mg total) by mouth daily. 02/04/15   Lindsea Olivar M Isaac Dubie, PA-C   BP 148/82 mmHg  Pulse 84  Temp(Src) 98.1 F (36.7 C) (Oral)  Resp 16  Ht 6'  8" (2.032 m)  Wt 255 lb (115.667 kg)  BMI 28.01 kg/m2  SpO2 100% Physical Exam  Constitutional: He is oriented to person, place, and time. He appears well-developed and well-nourished. No distress.  HENT:  Head: Normocephalic and atraumatic.  Mouth/Throat: Oropharynx is clear and moist.  Eyes: Conjunctivae are normal. No scleral icterus.  Neck: Normal range of motion. Neck supple.  Cardiovascular: Normal rate, regular rhythm and normal heart sounds.   Pulmonary/Chest: Effort normal and breath sounds normal.  Abdominal: Soft. Bowel sounds are normal. He exhibits no distension. There is no rebound and no guarding.  Epigastric tenderness. No peritoneal signs.  Musculoskeletal: Normal range of motion. He exhibits no edema.  Neurological: He is alert and oriented to person, place, and time.  Skin: Skin is warm and dry. He is not diaphoretic.  Psychiatric: He has a normal mood and affect. His behavior is normal.  Nursing note and vitals reviewed.   ED Course  Procedures (including critical care time) Labs Review Labs Reviewed  COMPREHENSIVE METABOLIC PANEL - Abnormal; Notable for the following:    AST 71 (*)    GFR calc non Af Amer 88 (*)    Anion gap 4 (*)    All other components within normal limits  CBC  LIPASE, BLOOD  URINALYSIS, ROUTINE W REFLEX MICROSCOPIC    Imaging Review No results found.   EKG Interpretation None      MDM   Final diagnoses:  Epigastric abdominal pain  Non-intractable vomiting with nausea, vomiting of unspecified type  Diarrhea  Non-toxic appearing and in NAD. AFVSS. Abdomen soft with epigastric tenderness. No peritoneal signs. Symptoms began after eating chinese food. Labs with elevation of AST at 71, otherwise no acute findings. No RUQ tenderness. Most likely viral illness. Doubt acute intra-abdominal process. Doubt cholecystitis, pancreatitis or appendicitis. After IV fluids, zofran and protonix, pt reports his symptoms have improved.  Abdominal exam improved. Stable for d/c. Rx zofran and protonix. BRAT diet. Return precautions given. Patient states understanding of treatment care plan and is agreeable.  Kathrynn Speed, PA-C 02/04/15 1459  Tilden Fossa, MD 02/05/15 [phone]

## 2015-07-16 NOTE — Telephone Encounter (Signed)
finished

## 2015-11-18 ENCOUNTER — Encounter (HOSPITAL_BASED_OUTPATIENT_CLINIC_OR_DEPARTMENT_OTHER): Payer: Self-pay | Admitting: *Deleted

## 2015-11-18 ENCOUNTER — Emergency Department (HOSPITAL_BASED_OUTPATIENT_CLINIC_OR_DEPARTMENT_OTHER)
Admission: EM | Admit: 2015-11-18 | Discharge: 2015-11-18 | Disposition: A | Discharge status: Home / Self Care | Payer: Self-pay | Attending: Emergency Medicine | Admitting: Emergency Medicine

## 2015-11-18 DIAGNOSIS — Z87891 Personal history of nicotine dependence: Secondary | ICD-10-CM | POA: Insufficient documentation

## 2015-11-18 DIAGNOSIS — Z79899 Other long term (current) drug therapy: Secondary | ICD-10-CM | POA: Insufficient documentation

## 2015-11-18 DIAGNOSIS — L0100 Impetigo, unspecified: Secondary | ICD-10-CM | POA: Insufficient documentation

## 2015-11-18 MED ORDER — CEPHALEXIN 500 MG PO CAPS: 500.0000 mg | ORAL_CAPSULE | Freq: Four times a day (QID) | ORAL | Status: DC

## 2015-11-18 MED ORDER — MUPIROCIN CALCIUM 2 % NA OINT: TOPICAL_OINTMENT | NASAL | Status: DC

## 2015-11-18 NOTE — ED Notes (Signed)
Abscess open and draining under his chin.

## 2015-11-18 NOTE — ED Provider Notes (Signed)
CSN: [id number]     Arrival date & time 11/18/15  1057 History   First MD Initiated Contact with Patient 11/18/15 1208     Chief Complaint  Patient presents with  . Abscess     (Consider location/radiation/quality/duration/timing/severity/associated sxs/prior Treatment) The history is provided by the patient and medical records. No language interpreter was used.     Seth RubinsJames D Bird is a 39 y.o. male  With no major medical hx presents to the Emergency Department complaining of gradual, persistent, progressively worsening itching and drainage from his beard.   Pt reports he went to get a haircut over the weekend (3 days ago) and while he was there they used "[employer]" hair dye and then he began to experience symptoms that night.  Initally the entire area itched then turned red.  Pt noted weeping from the site and then noted yellow weeping from the site yesterday.  Pt denies treatment PTA.  He reports using hair dye in the past without an allergic reaction.  No itching or rash anywhere else.  Pt denies hx of abscess.  Pt without known exposure to MRSA.  Nothing makes it better and scratching makes it worse.  Pt denies fever, chills, headache, neck pain, lymphadenopathy, abd pain, N/V/D, weakness, dizziness, syncope.      History reviewed. No pertinent past medical history. History reviewed. No pertinent past surgical history. No family history on file. Social History  Substance Use Topics  . Smoking status: Former Smoker    Quit date: 06/12/2012  . Smokeless tobacco: None  . Alcohol Use: No    Review of Systems  Constitutional: Negative for fever and chills.  Gastrointestinal: Negative for nausea and vomiting.  Endocrine: Negative for polydipsia, polyphagia and polyuria.  Skin: Positive for rash and wound.  Allergic/Immunologic: Negative for immunocompromised state.  Hematological: Does not bruise/bleed easily.  Psychiatric/Behavioral: The patient is not nervous/anxious.        Allergies  Review of patient's allergies indicates no known allergies.  Home Medications   Prior to Admission medications   Medication Sig Start Date End Date Taking? Authorizing Provider  cephALEXin (KEFLEX) 500 MG capsule Take 1 capsule (500 mg total) by mouth 4 (four) times daily. 11/18/15   Seth Kocher, PA-C  mupirocin nasal ointment (BACTROBAN) 2 % Apply to rash on face daily until healed 11/18/15   Dahlia ClientHannah Juniper Cobey, PA-C  ondansetron (ZOFRAN) 4 MG tablet Take 1 tablet (4 mg total) by mouth every 6 (six) hours. 02/04/15   Robyn M Hess, PA-C  pantoprazole (PROTONIX) 20 MG tablet Take 1 tablet (20 mg total) by mouth daily. 02/04/15   Robyn M Hess, PA-C   BP 138/81 mmHg  Pulse 64  Temp(Src) 99 F (37.2 C) (Oral)  Resp 18  Ht 6\' 8"  (2.032 m)  Wt 99.791 kg  BMI 24.17 kg/m2  SpO2 99% Physical Exam  Constitutional: He is oriented to person, place, and time. He appears well-developed and well-nourished. No distress.  HENT:  Head: Normocephalic and atraumatic.  Right Ear: Tympanic membrane, external ear and ear canal normal.  Left Ear: Tympanic membrane, external ear and ear canal normal.  Nose: Nose normal. No mucosal edema or rhinorrhea.  Mouth/Throat: Uvula is midline and oropharynx is clear and moist. No uvula swelling. No oropharyngeal exudate, posterior oropharyngeal edema, posterior oropharyngeal erythema or tonsillar abscesses.  No swelling of the uvula or oropharynx   Eyes: Conjunctivae are normal.  Neck: Normal range of motion.  Patent airway No stridor; normal phonation  Handling secretions without difficulty  Cardiovascular: Normal rate, normal heart sounds and intact distal pulses.   No murmur heard. Pulmonary/Chest: Effort normal and breath sounds normal. No stridor. No respiratory distress. He has no wheezes.  No wheezes or rhonchi  Abdominal: Soft. Bowel sounds are normal. There is no tenderness.  Musculoskeletal: Normal range of motion. He  exhibits no edema.  Neurological: He is alert and oriented to person, place, and time.  Skin: Skin is warm and dry. Rash noted. He is not diaphoretic.  Multiple weeping, erythematous, shallow erosions with serous discharge located along the inferior mandible and throughout the patient's beard Mild excoriations - no induration or fluctuance to indicate abscess  Psychiatric: He has a normal mood and affect.  Nursing note and vitals reviewed.   ED Course  Procedures (including critical care time) Labs Review Labs Reviewed - No data to display  Imaging Review No results found. I have personally reviewed and evaluated these images and lab results as part of my medical decision-making.   EKG Interpretation None      MDM   Final diagnoses:  Impetigo   Seth Bird presents with history consistent with initial contact dermatitis progressing into impetigo likely secondary to significant scratching. No urticaria noted on exam however patient with honey-colored crusts and serous drainage throughout the beard.  No discal exam findings to indicate abscess formation. Patient given Bactroban and Keflex. Recommend Benadryl OTC for itching. Wound care instructions given. Reasons to return to the emergency department also given. Patient to follow with primary care physician.  BP 138/81 mmHg  Pulse 64  Temp(Src) 99 F (37.2 C) (Oral)  Resp 18  Ht  (2.032 m)  Wt 99.791 kg  BMI 24.17 kg/m2  SpO2 99%   Seth Forth, PA-C 11/18/15 1253  Seth Lyons, MD 11/18/15 [phone]

## 2015-11-18 NOTE — Discharge Instructions (Signed)
1. Medications: Keflex, Bactroban, over-the-counter Benadryl for itching, usual home medications 2. Treatment: rest, drink plenty of fluids, keep wounds clean and dry 3. Follow Up: Please followup with your primary doctor in 3 days for discussion of your diagnoses and further evaluation after today's visit; if you do not have a primary care doctor use the resource guide provided to find one; Please return to the ER for worsening symptoms, fevers or other concerns   Impetigo, Adult Impetigo is an infection of the skin. It commonly occurs in young children, but it can also occur in adults. The infection causes itchy blisters and sores that produce brownish-yellow fluid. As the fluid dries, it forms a thick, honey-colored crust. These skin changes usually occur on the face but can also affect other areas of the body. Impetigo usually goes away in 7-10 days with treatment. CAUSES Impetigo is caused by two types of bacteria. It may be caused by staphylococci or streptococci bacteria. These bacteria cause impetigo when they get under the surface of the skin. This often happens after some damage to the skin, such as damage from:  Cuts, scrapes, or scratches.  Insect bites, especially when you scratch the area of a bite.  Chickenpox or other illnesses that cause open skin sores.  Nail biting or chewing. Impetigo is contagious and can spread easily from one person to another. This may occur through close skin contact or by sharing towels, clothing, or other items with a person who has the infection. RISK FACTORS Some things that can increase the risk of getting this infection include:  Playing sports that include skin-to-skin contact with others.  Having a skin condition with open sores.  Having many skin cuts or scrapes.  Living in an area that has high humidity levels.  Having poor hygiene.  Having high levels of staphylococci in your nose. SIGNS AND SYMPTOMS Impetigo usually starts out  as small blisters, often on the face. The blisters then break open and turn into tiny sores (lesions) with a yellow crust. In some cases, the blisters cause itching or burning. With scratching, irritation, or lack of treatment, these small lesions may get larger. Scratching can also cause impetigo to spread to other parts of the body. The bacteria can get under the fingernails and spread when you touch another area of your skin. Other possible symptoms include:  Larger blisters.  Pus.  Swollen lymph glands. DIAGNOSIS This condition is usually diagnosed during a physical exam. A skin sample or sample of fluid from a blister may be taken for lab tests that involve growing bacteria (culture test). This can help confirm the diagnosis or help determine the best treatment. TREATMENT Mild impetigo can be treated with prescription antibiotic cream. Oral antibiotic medicine may be used in more severe cases. Medicines for itching may also be used. HOME CARE INSTRUCTIONS  Take medicines only as directed by your health care provider.  To help prevent impetigo from spreading to other body areas:  Keep your fingernails short and clean.  Do not scratch the blisters or sores.  Cover infected areas, if necessary, to keep from scratching.  Gently wash the infected areas with antibiotic soap and water.  Soak crusted areas in warm, soapy water using antibiotic soap.  Gently rub the areas to remove crusts. Do not scrub.  Wash your hands often to avoid spreading this infection.  Stay home until you have used an antibiotic cream for 48 hours (2 days) or an oral antibiotic medicine for 24 hours (1  day). You should only return to work and activities with other people if your skin shows significant improvement. PREVENTION  To keep the infection from spreading:  Stay home until you have used an antibiotic cream for 48 hours or an oral antibiotic for 24 hours.  Wash your hands often.  Do not engage in  skin-to-skin contact with other people while you have still have blisters.  Do not share towels, washcloths, or bedding with others while you have the infection. SEEK MEDICAL CARE IF:  You develop more blisters or sores despite treatment.  Other family members get sores.  Your skin sores are not improving after 48 hours of treatment.  You have a fever. SEEK IMMEDIATE MEDICAL CARE IF:  You see spreading redness or swelling of the skin around your sores.  You see red streaks coming from your sores.  You develop a sore throat.   This information is not intended to replace advice given to you by your health care provider. Make sure you discuss any questions you have with your health care provider.   Document Released: 12/20/2014 Document Reviewed: 12/20/2014 Elsevier Interactive Patient Education [employer]2016 [employer] Inc.

## 2015-12-22 ENCOUNTER — Emergency Department (HOSPITAL_BASED_OUTPATIENT_CLINIC_OR_DEPARTMENT_OTHER): Payer: Self-pay

## 2015-12-22 ENCOUNTER — Encounter (HOSPITAL_BASED_OUTPATIENT_CLINIC_OR_DEPARTMENT_OTHER): Payer: Self-pay | Admitting: Emergency Medicine

## 2015-12-22 ENCOUNTER — Emergency Department (HOSPITAL_BASED_OUTPATIENT_CLINIC_OR_DEPARTMENT_OTHER)
Admission: EM | Admit: 2015-12-22 | Discharge: 2015-12-22 | Disposition: A | Discharge status: Home / Self Care | Payer: Self-pay | Attending: Emergency Medicine | Admitting: Emergency Medicine

## 2015-12-22 DIAGNOSIS — Y9301 Activity, walking, marching and hiking: Secondary | ICD-10-CM | POA: Insufficient documentation

## 2015-12-22 DIAGNOSIS — Y998 Other external cause status: Secondary | ICD-10-CM | POA: Insufficient documentation

## 2015-12-22 DIAGNOSIS — S8391XA Sprain of unspecified site of right knee, initial encounter: Secondary | ICD-10-CM | POA: Insufficient documentation

## 2015-12-22 DIAGNOSIS — Y9289 Other specified places as the place of occurrence of the external cause: Secondary | ICD-10-CM | POA: Insufficient documentation

## 2015-12-22 DIAGNOSIS — W000XXA Fall on same level due to ice and snow, initial encounter: Secondary | ICD-10-CM | POA: Insufficient documentation

## 2015-12-22 MED ORDER — NAPROXEN 500 MG PO TABS: 500.0000 mg | ORAL_TABLET | Freq: Two times a day (BID) | ORAL | Status: DC

## 2015-12-22 NOTE — Discharge Instructions (Signed)
Please read and follow all provided instructions.  Your diagnoses today include:  1. Knee sprain, right, initial encounter     Tests performed today include:  An x-ray of the affected area - does NOT show any broken bones  Vital signs. See below for your results today.   Medications prescribed:   Naproxen - anti-inflammatory pain medication  Do not exceed 500mg  naproxen every 12 hours, take with food  You have been prescribed an anti-inflammatory medication or NSAID. Take with food. Take smallest effective dose for the shortest duration needed for your pain. Stop taking if you experience stomach pain or vomiting.   Take any prescribed medications only as directed.  Home care instructions:   Follow any educational materials contained in this packet  Follow R.I.C.E. Protocol:  R - rest your injury   I  - use ice on injury without applying directly to skin  C - compress injury with bandage or splint  E - elevate the injury as much as possible  Follow-up instructions: Please follow-up with your primary care provider or the provided orthopedic physician (bone specialist) if you continue to have significant pain in 1 week. In this case you may have a more severe injury that requires further care.   Return instructions:   Please return if your toes or feet are numb or tingling, appear gray or blue, or you have severe pain (also elevate the leg and loosen splint or wrap if you were given one)  Please return to the Emergency Department if you experience worsening symptoms.   Please return if you have any other emergent concerns.  Additional Information:  Your vital signs today were: BP 139/88 mmHg   Pulse 74   Temp(Src) 98.6 F (37 C) (Oral)   Resp 16   Ht 6\' 8"  (2.032 m)   Wt 102.059 kg   BMI 24.72 kg/m2   SpO2 99% If your blood pressure (BP) was elevated above 135/85 this visit, please have this repeated by your doctor within one month. -------------- If prescribed  crutches for your injury: use crutches with non-weight bearing for the first few days. Then, you may walk as the pain allows, or as instructed. Start gradually with weight bearing on the affected side. Once you can walk pain free, then try jogging. When you can run forwards, then you can try moving side-to-side. If you cannot walk without crutches in one week, you need a re-check. --------------

## 2015-12-22 NOTE — ED Provider Notes (Signed)
CSN: [id number]     Arrival date & time 12/22/15  [id number] History   First MD Initiated Contact with Patient 12/22/15 0957     Chief Complaint  Patient presents with  . Knee Pain     (Consider location/radiation/quality/duration/timing/severity/associated sxs/prior Treatment) HPI Comments: Patient presents with acute onset of right knee pain starting yesterday after slipping on ice and falling to the ground. Patient thinks that he may have twisted and extended his knee. He does not report any other injuries. Pain worsened overnight. It is associated with a mild swelling. Patient noted some "tingling" into this lower leg. No treatments prior to arrival. Denies head or neck injury.  Patient is a 40 y.o. male presenting with knee pain. The history is provided by the patient.  Knee Pain Associated symptoms: no back pain and no neck pain     History reviewed. No pertinent past medical history. History reviewed. No pertinent past surgical history. History reviewed. No pertinent family history. Social History  Substance Use Topics  . Smoking status: Former Smoker    Quit date: 06/12/2012  . Smokeless tobacco: None  . Alcohol Use: No    Review of Systems  Constitutional: Negative for activity change.  Musculoskeletal: Positive for joint swelling and arthralgias. Negative for back pain, gait problem and neck pain.  Skin: Negative for wound.  Neurological: Negative for weakness and numbness.      Allergies  Review of patient's allergies indicates no known allergies.  Home Medications   Prior to Admission medications   Not on File   BP 139/88 mmHg  Pulse 74  Temp(Src) 98.6 F (37 C) (Oral)  Resp 16  Ht 6\' 8"  (2.032 m)  Wt 102.059 kg  BMI 24.72 kg/m2  SpO2 99% Physical Exam  Constitutional: He appears well-developed and well-nourished.  HENT:  Head: Normocephalic and atraumatic.  Eyes: Conjunctivae are normal.  Neck: Normal range of motion. Neck supple.  Cardiovascular:  Normal pulses.   Musculoskeletal: He exhibits tenderness. He exhibits no edema.       Right hip: Normal.       Right knee: He exhibits swelling (Mild). He exhibits normal range of motion. Tenderness found. Lateral joint line tenderness noted. No medial joint line, no MCL, no LCL and no patellar tendon tenderness noted.       Right ankle: Normal.  Patient ambulates without difficulty.  Neurological: He is alert. No sensory deficit.  Motor, sensation, and vascular distal to the injury is fully intact.   Skin: Skin is warm and dry.  Psychiatric: He has a normal mood and affect.  Nursing note and vitals reviewed.   ED Course  Procedures (including critical care time) Labs Review Labs Reviewed - No data to display  Imaging Review Dg Knee Complete 4 Views Right  12/22/2015  CLINICAL DATA:  Status post fall.  Twisting injury. EXAM: RIGHT KNEE - COMPLETE 4+ VIEW COMPARISON:  None. FINDINGS: There is no evidence of fracture, dislocation, or joint effusion. There is no evidence of arthropathy or other focal bone abnormality. Soft tissues are unremarkable. IMPRESSION: No acute osseous injury of the right knee. Electronically Signed   By: Elige KoHetal  Patel   On: 12/22/2015 10:32   I have personally reviewed and evaluated these images and lab results as part of my medical decision-making.   EKG Interpretation None       10:26 AM Patient seen and examined. Work-up initiated.   Vital signs reviewed and are as follows: BP 139/88 mmHg  Pulse  74  Temp(Src) 98.6 F (37 C) (Oral)  Resp 16  Ht 6\' 8"  (2.032 m)  Wt 102.059 kg  BMI 24.72 kg/m2  SpO2 99%  10:49 AM x-ray negative. Patient informed. Patient given crutches and Ace wrap. Encouraged sports medicine follow-up if not improved in the next week. Patient has seen Dr. Pearletha Forge in the past.  Patient was counseled on RICE protocol and told to rest injury, use ice for no longer than 15 minutes every hour, compress the area, and elevate above the level  of their heart as much as possible to reduce swelling. Questions answered. Patient verbalized understanding.    Return with worsening or changing symptoms, severe swelling, inability to walk. Patient verbalizes understanding and agrees with plan.  MDM   Final diagnoses:  Knee sprain, right, initial encounter   Knee injury after fall. Imaging negative. No neurological deficits. Patient ambulatory. Conservative measures with follow-up as above.    Renne Crigler, PA-C 12/22/15 1050  Azalia Bilis, MD 12/22/15 [id number]

## 2015-12-22 NOTE — ED Notes (Signed)
Pt reports that he slipped yesterday afternoon while walking on sidewalk, right leg went out from under him and went straight out and over extended his right knee. Pt states he fell all the way to ground landing on buttocks, denies any other injury

## 2015-12-22 NOTE — ED Notes (Signed)
Patient ambulates to and from radiology department. 

## 2016-03-16 ENCOUNTER — Ambulatory Visit (INDEPENDENT_AMBULATORY_CARE_PROVIDER_SITE_OTHER): Payer: 59 | Admitting: Physician Assistant

## 2016-03-16 VITALS — BP 138/80 | HR 62 | Temp 97.9°F | Resp 16 | Ht >= 80 in | Wt 263.6 lb

## 2016-03-16 DIAGNOSIS — R21 Rash and other nonspecific skin eruption: Secondary | ICD-10-CM | POA: Diagnosis not present

## 2016-03-16 DIAGNOSIS — B084 Enteroviral vesicular stomatitis with exanthem: Secondary | ICD-10-CM

## 2016-03-16 LAB — POCT CBC
Granulocyte percent: 60.8 %G (ref 37–80)
HCT, POC: 41 % — AB (ref 43.5–53.7)
HEMOGLOBIN: 14.1 g/dL (ref 14.1–18.1)
Lymph, poc: 2.9 (ref 0.6–3.4)
MCH: 27.5 pg (ref 27–31.2)
MCHC: 34.3 g/dL (ref 31.8–35.4)
MCV: 80.1 fL (ref 80–97)
MID (cbc): 0.4 (ref 0–0.9)
MPV: 10 fL (ref 0–99.8)
POC GRANULOCYTE: 5 (ref 2–6.9)
POC LYMPH PERCENT: 34.5 %L (ref 10–50)
POC MID %: 4.7 %M (ref 0–12)
Platelet Count, POC: 161 10*3/uL (ref 142–424)
RBC: 5.12 M/uL (ref 4.69–6.13)
RDW, POC: 15.1 %
WBC: 8.3 10*3/uL (ref 4.6–10.2)

## 2016-03-16 NOTE — Progress Notes (Signed)
[facility] [address], [place] [address] [id number]  Date:  03/16/2016   Name:  Seth Bird   DOB:  [birth year]   MRN:  [id number]  PCP:  No PCP Per Patient    Chief Complaint: Hand Pain and foot pain   History of Present Illness:  This is a 40 y.o. male who is presenting with bilateral hand and foot pain x 2 weeks. States his skin is breaking out. Seems to be getting worse. Lesions are sore and a little itchy as well. He has been applying hydrocortisone but not helping. No lesions anywhere else. No oral lesions or pain. Denies cough, fever, nasal congestion. He is frequently around his girlfriend's 15 year old son but he has not been sick as far as he knows.  Review of Systems:  Review of Systems See HPI  Patient Active Problem List   Diagnosis Date Noted  . De Quervain's tenosynovitis, right 07/22/2014  . Lower back injury 07/04/2014    Prior to Admission medications   Medication Sig Start Date End Date Taking? Authorizing Provider  naproxen (NAPROSYN) 500 MG tablet Take 1 tablet (500 mg total) by mouth 2 (two) times daily. 12/22/15  Yes Renne Crigler, PA-C    No Known Allergies  History reviewed. No pertinent past surgical history.  Social History  Substance Use Topics  . Smoking status: Former Smoker    Quit date: 06/12/2012  . Smokeless tobacco: None  . Alcohol Use: No    History reviewed. No pertinent family history.  Medication list has been reviewed and updated.  Physical Examination:  Physical Exam  Constitutional: He is oriented to person, place, and time. He appears well-developed and well-nourished. No distress.  HENT:  Head: Normocephalic and atraumatic.  Right Ear: Hearing normal.  Left Ear: Hearing normal.  Nose: Nose normal.  Mouth/Throat: Uvula is midline, oropharynx is clear and moist and mucous membranes are normal.  No oral lesions  Eyes: Conjunctivae and lids are normal. Right eye exhibits no discharge. Left  eye exhibits no discharge. No scleral icterus.  Cardiovascular: Normal rate, regular rhythm, normal heart sounds and normal pulses.   No murmur heard. Pulmonary/Chest: Effort normal and breath sounds normal. No respiratory distress. He has no wheezes. He has no rhonchi. He has no rales.  Musculoskeletal: Normal range of motion.  Lymphadenopathy:       Head (right side): No submental, no submandibular and no tonsillar adenopathy present.       Head (left side): No submental, no submandibular and no tonsillar adenopathy present.    He has no cervical adenopathy.  Neurological: He is alert and oriented to person, place, and time.  Skin: Skin is warm and dry.  Vesicles, erythema and desquamation on bilateral palms and bilateral soles.  Psychiatric: He has a normal mood and affect. His speech is normal and behavior is normal. Thought content normal.   BP 138/80 mmHg  Pulse 62  Temp(Src) 97.9 F (36.6 C) (Oral)  Resp 16  Ht  (2.032 m)  Wt 263 lb 9.6 oz (119.568 kg)  BMI 28.96 kg/m2  SpO2 98%  Results for orders placed or performed in visit on 03/16/16  POCT CBC  Result Value Ref Range   WBC 8.3 4.6 - 10.2 K/uL   Lymph, poc 2.9 0.6 - 3.4   POC LYMPH PERCENT 34.5 10 - 50 %L   MID (cbc) 0.4 0 - 0.9   POC MID % 4.7 0 -  12 %M   POC Granulocyte 5.0 2 - 6.9   Granulocyte percent 60.8 37 - 80 %G   RBC 5.12 4.69 - 6.13 M/uL   Hemoglobin 14.1 14.1 - 18.1 g/dL   HCT, POC 56.241.0 (A) 13.043.5 - 53.7 %   MCV 80.1 80 - 97 fL   MCH, POC 27.5 27 - 31.2 pg   MCHC 34.3 31.8 - 35.4 g/dL   RDW, POC 86.515.1 %   Platelet Count, POC 161 142 - 424 K/uL   MPV 10.0 0 - 99.8 fL    Assessment and Plan:  1. Hand, foot and mouth disease 2. Rash Suspect hand foot and mouth disease. CBC wnl. RPR pending. Counseled on supportive care, return if symptoms not improved in 1 week. - POCT CBC - RPR   Roswell MinersNicole V. Dyke BrackettBush, PA-C, MHS [facility]  03/16/2016

## 2016-03-16 NOTE — Patient Instructions (Addendum)
Take ibuprofen or tylenol for pain. Stay hydrated, 64 oz water a day. Return if symptoms not resolving in 1 week   IF you received an x-ray today, you will receive an invoice from [facility]. Please contact [facility] at [phone] with questions or concerns regarding your invoice.   IF you received labwork today, you will receive an invoice from [facility]. Please contact [facility] at [phone] with questions or concerns regarding your invoice.   Our billing staff will not be able to assist you with questions regarding bills from these companies.  You will be contacted with the lab results as soon as they are available. The fastest way to get your results is to activate your My Chart account. Instructions are located on the last page of this paperwork. If you have not heard from us regarding the results in 2 weeks, please contact this office.

## 2016-03-17 LAB — RPR

## 2016-04-30 IMAGING — DX DG KNEE COMPLETE 4+V*R*
4 series · 4 of 4 positions shown · non-contrast
Comparison: None.

CLINICAL DATA: Status post fall.  Twisting injury.

EXAM:
RIGHT KNEE - COMPLETE 4+ VIEW

[knee ap]
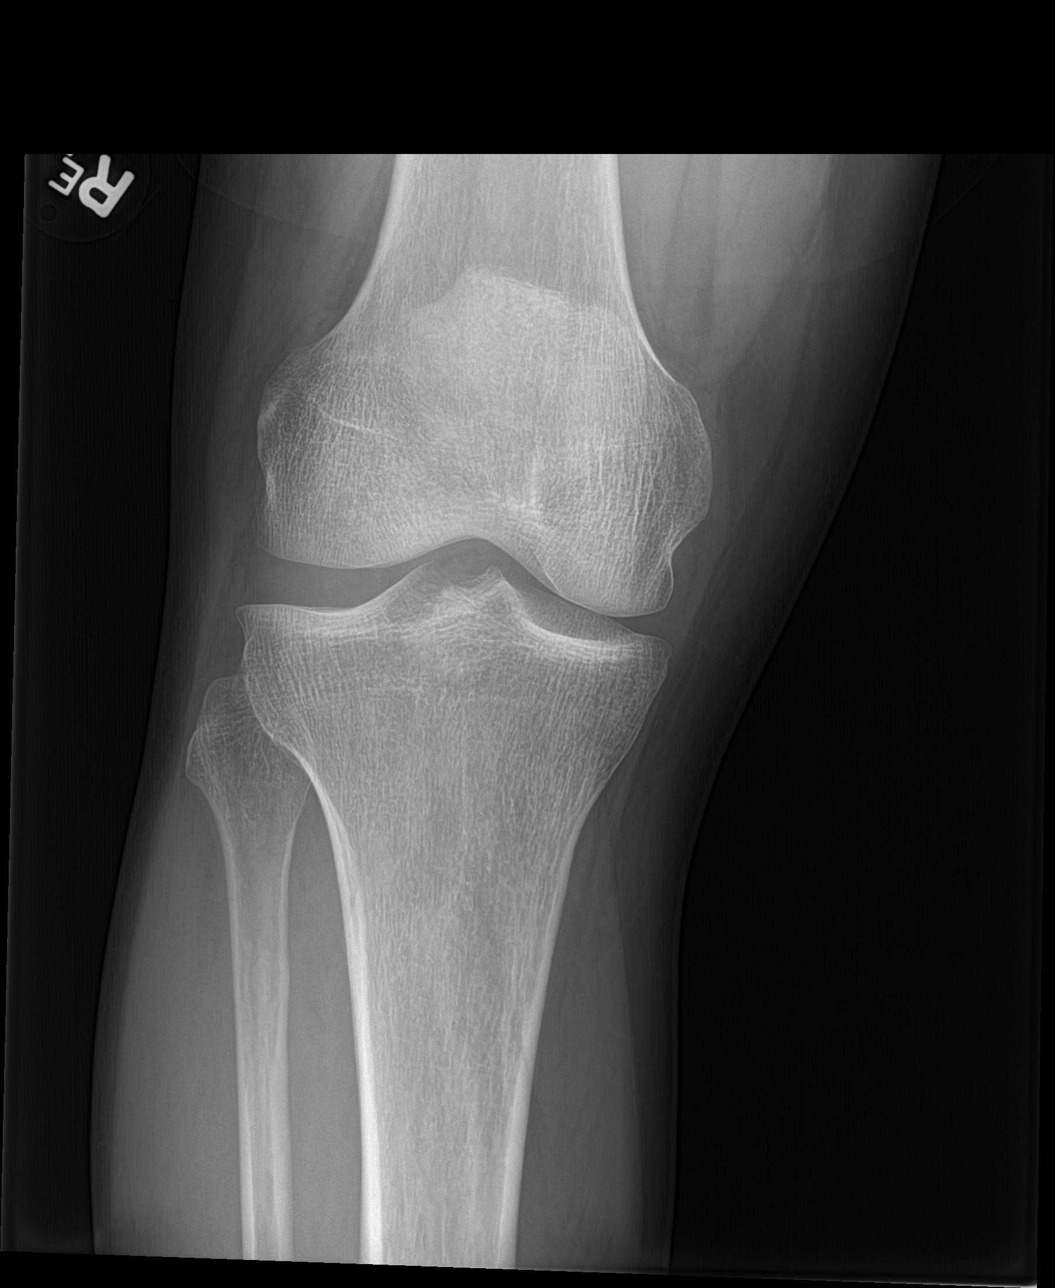

[tunnel]
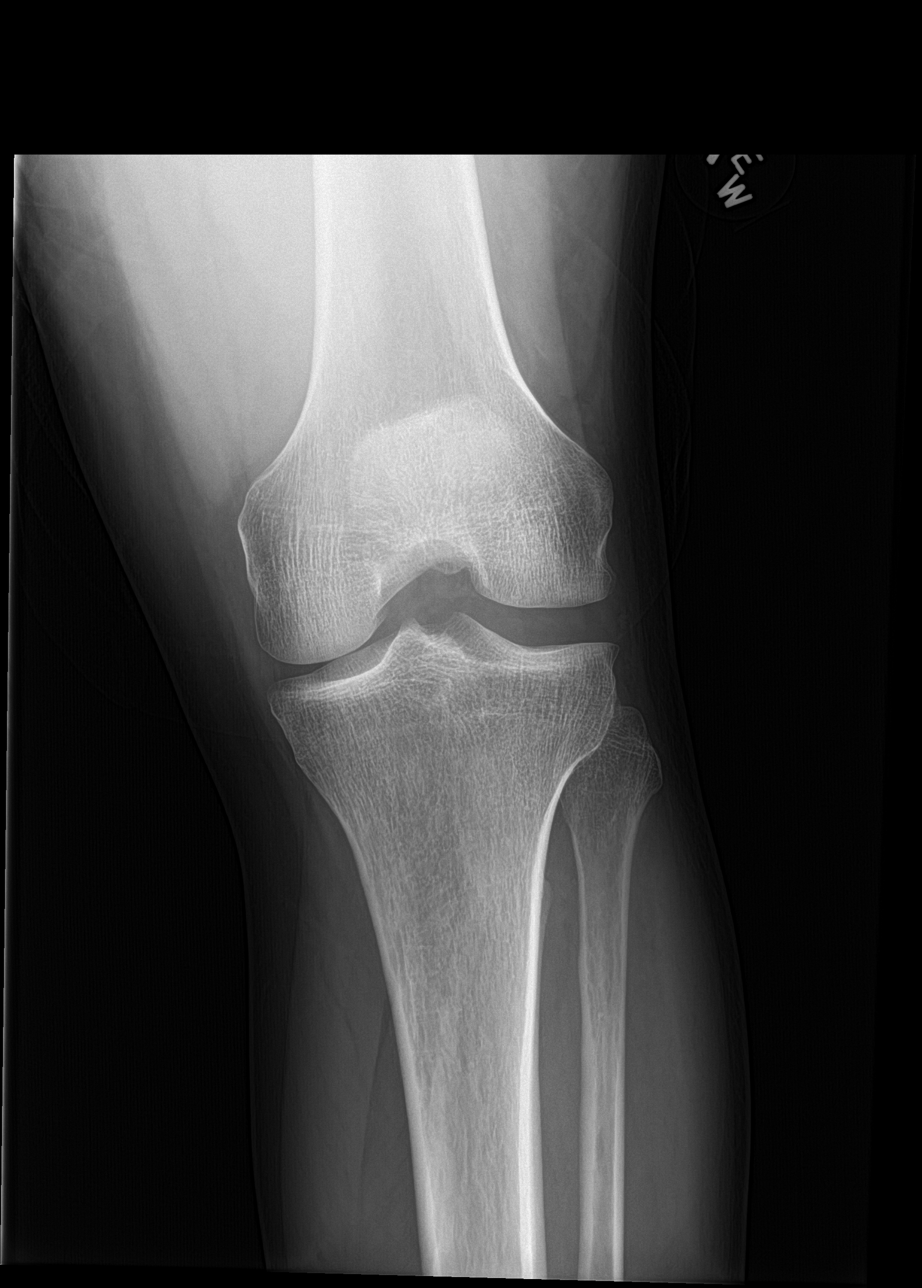

[knee lat]
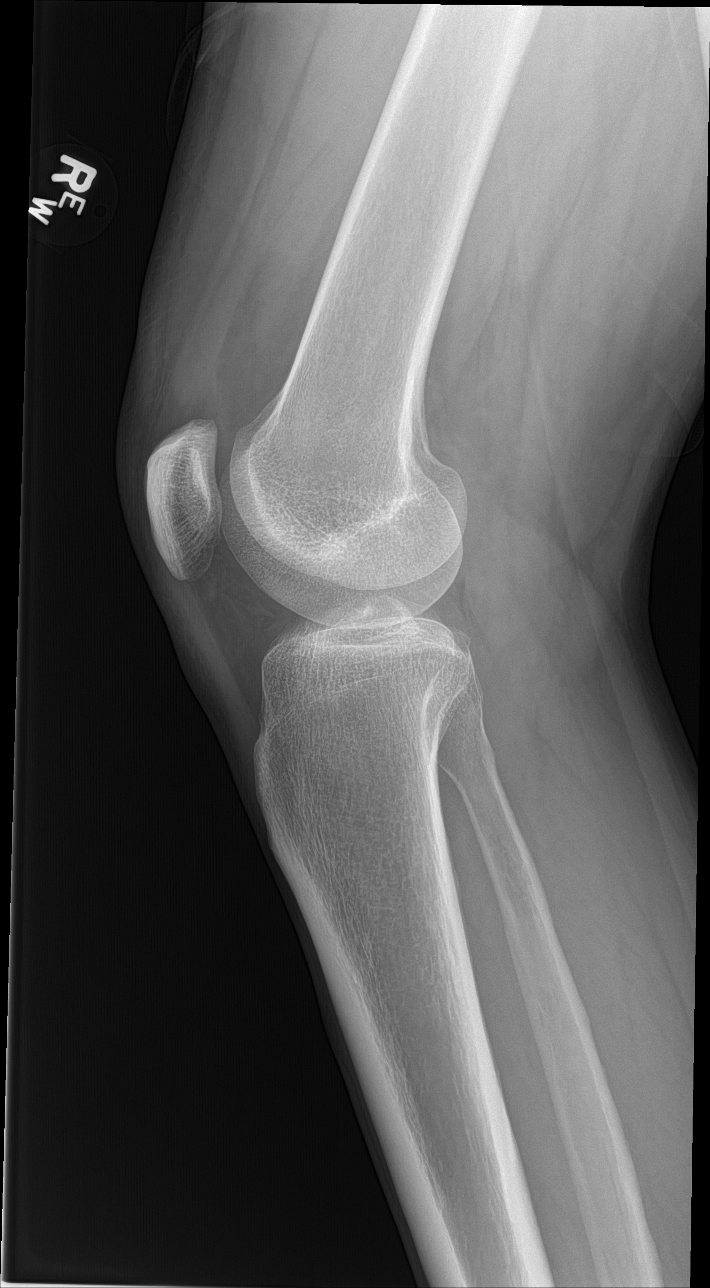

[knee sunrise]
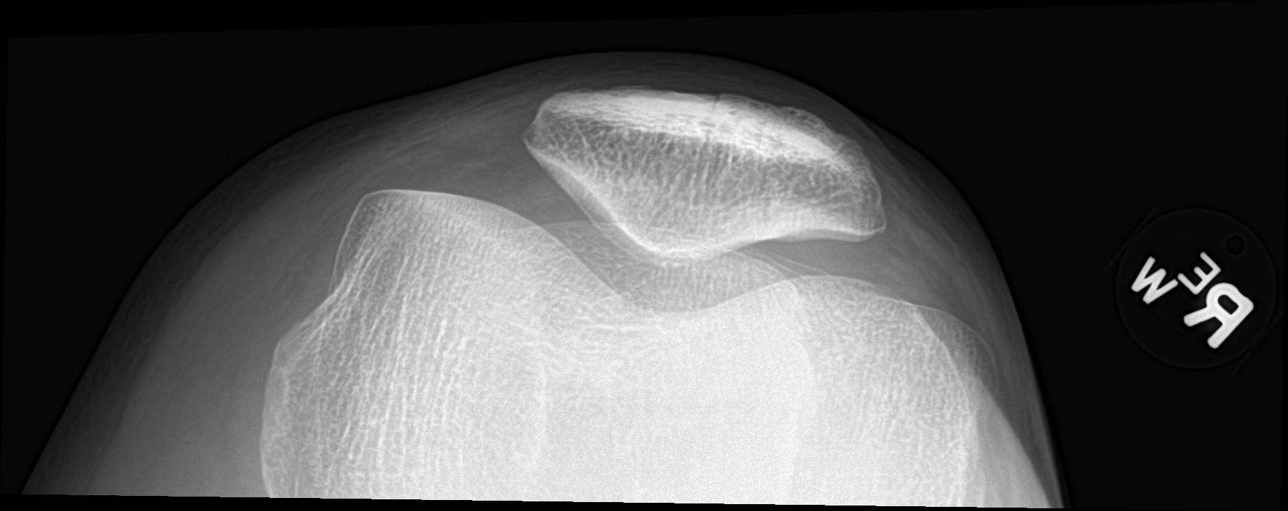

[4 of 4 positions shown; findings below may reference images not displayed]

FINDINGS: There is no evidence of fracture, dislocation, or joint effusion.
There is no evidence of arthropathy or other focal bone abnormality.
Soft tissues are unremarkable.
IMPRESSION: No acute osseous injury of the right knee.

## 2017-09-14 ENCOUNTER — Emergency Department (HOSPITAL_BASED_OUTPATIENT_CLINIC_OR_DEPARTMENT_OTHER): Payer: 59

## 2017-09-14 ENCOUNTER — Emergency Department (HOSPITAL_BASED_OUTPATIENT_CLINIC_OR_DEPARTMENT_OTHER)
Admission: EM | Admit: 2017-09-14 | Discharge: 2017-09-14 | Disposition: A | Discharge status: Home / Self Care | Payer: 59 | Attending: Emergency Medicine | Admitting: Emergency Medicine

## 2017-09-14 ENCOUNTER — Encounter (HOSPITAL_BASED_OUTPATIENT_CLINIC_OR_DEPARTMENT_OTHER): Payer: Self-pay

## 2017-09-14 DIAGNOSIS — Z87891 Personal history of nicotine dependence: Secondary | ICD-10-CM | POA: Diagnosis not present

## 2017-09-14 DIAGNOSIS — M542 Cervicalgia: Secondary | ICD-10-CM | POA: Insufficient documentation

## 2017-09-14 DIAGNOSIS — R51 Headache: Secondary | ICD-10-CM | POA: Diagnosis not present

## 2017-09-14 DIAGNOSIS — M545 Low back pain, unspecified: Secondary | ICD-10-CM

## 2017-09-14 MED ORDER — KETOROLAC TROMETHAMINE 30 MG/ML IJ SOLN
15.0000 mg | Freq: Once | INTRAMUSCULAR | Status: DC
  Filled 2017-09-14: qty 1

## 2017-09-14 MED ORDER — NAPROXEN 250 MG PO TABS: 500.0000 mg | ORAL_TABLET | Freq: Two times a day (BID) | ORAL | 0 refills | Status: DC

## 2017-09-14 MED ORDER — CYCLOBENZAPRINE HCL 10 MG PO TABS: 10.0000 mg | ORAL_TABLET | Freq: Every evening | ORAL | 0 refills | Status: DC | PRN

## 2017-09-14 NOTE — Discharge Instructions (Signed)
Please read instructions below.  Talk with your PCP about any new medications.  You can take naprosyn 2 times daily for pain.  You can take flexeril as needed for muscle spasm. Drink plenty of water. Apply ice to your neck and back for 20 minutes at a time. Rest your brain for the next week until you have followed up with your primary care. Limit your screen time and avoid complex thinking to allow your brain to rest. Return to ER if new numbness or tingling in your arms or legs, inability to urinate, inability to hold your bowels, weakness in your extremities, severely worsening headache, vision changes, vomiting, or new or concerning symptoms.
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
## 2017-09-14 NOTE — ED Notes (Signed)
Patient transported to CT 
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
## 2017-11-28 ENCOUNTER — Other Ambulatory Visit: Payer: Self-pay

## 2017-11-28 ENCOUNTER — Emergency Department (HOSPITAL_BASED_OUTPATIENT_CLINIC_OR_DEPARTMENT_OTHER)
Admission: EM | Admit: 2017-11-28 | Discharge: 2017-11-28 | Discharge status: Against Medical Advice | Payer: 59 | Attending: Emergency Medicine | Admitting: Emergency Medicine

## 2017-11-28 ENCOUNTER — Emergency Department (HOSPITAL_BASED_OUTPATIENT_CLINIC_OR_DEPARTMENT_OTHER): Payer: 59

## 2017-11-28 ENCOUNTER — Encounter (HOSPITAL_BASED_OUTPATIENT_CLINIC_OR_DEPARTMENT_OTHER): Payer: Self-pay | Admitting: Emergency Medicine

## 2017-11-28 DIAGNOSIS — Y999 Unspecified external cause status: Secondary | ICD-10-CM | POA: Diagnosis not present

## 2017-11-28 DIAGNOSIS — M545 Low back pain: Secondary | ICD-10-CM | POA: Insufficient documentation

## 2017-11-28 DIAGNOSIS — Y92410 Unspecified street and highway as the place of occurrence of the external cause: Secondary | ICD-10-CM | POA: Diagnosis not present

## 2017-11-28 DIAGNOSIS — Y9389 Activity, other specified: Secondary | ICD-10-CM | POA: Diagnosis not present

## 2017-11-28 DIAGNOSIS — Z5329 Procedure and treatment not carried out because of patient's decision for other reasons: Secondary | ICD-10-CM | POA: Diagnosis not present

## 2017-11-28 DIAGNOSIS — R51 Headache: Secondary | ICD-10-CM | POA: Diagnosis not present

## 2017-11-28 DIAGNOSIS — Z87891 Personal history of nicotine dependence: Secondary | ICD-10-CM | POA: Insufficient documentation

## 2017-11-28 DIAGNOSIS — Z79899 Other long term (current) drug therapy: Secondary | ICD-10-CM | POA: Diagnosis not present

## 2017-11-28 MED ORDER — IBUPROFEN 800 MG PO TABS
800.0000 mg | ORAL_TABLET | Freq: Once | ORAL | Status: AC
  Administered 2017-11-28: 800 mg via ORAL
  Filled 2017-11-28: qty 1

## 2017-11-28 NOTE — Discharge Instructions (Signed)
The xray of your back did not show

## 2017-11-28 NOTE — ED Notes (Signed)
Pt. Just returned from radiology  

## 2017-11-28 NOTE — ED Notes (Signed)
Pt states he is leaving. Will return tomorrow.
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
## 2017-12-28 ENCOUNTER — Other Ambulatory Visit: Payer: Self-pay | Admitting: Sports Medicine

## 2017-12-28 DIAGNOSIS — M545 Low back pain: Secondary | ICD-10-CM

## 2017-12-28 DIAGNOSIS — M5416 Radiculopathy, lumbar region: Secondary | ICD-10-CM

## 2018-01-01 ENCOUNTER — Ambulatory Visit
Admission: RE | Admit: 2018-01-01 | Discharge: 2018-01-01 | Disposition: A | Discharge status: Home / Self Care | Payer: 59 | Source: Ambulatory Visit | Attending: Sports Medicine | Admitting: Sports Medicine

## 2018-01-01 DIAGNOSIS — M5416 Radiculopathy, lumbar region: Secondary | ICD-10-CM

## 2018-01-01 DIAGNOSIS — M545 Low back pain: Secondary | ICD-10-CM

## 2018-04-16 ENCOUNTER — Encounter (HOSPITAL_BASED_OUTPATIENT_CLINIC_OR_DEPARTMENT_OTHER): Payer: Self-pay | Admitting: Emergency Medicine

## 2018-04-16 ENCOUNTER — Emergency Department (HOSPITAL_BASED_OUTPATIENT_CLINIC_OR_DEPARTMENT_OTHER)
Admission: EM | Admit: 2018-04-16 | Discharge: 2018-04-16 | Disposition: A | Discharge status: Home / Self Care | Payer: 59 | Attending: Emergency Medicine | Admitting: Emergency Medicine

## 2018-04-16 ENCOUNTER — Encounter (HOSPITAL_BASED_OUTPATIENT_CLINIC_OR_DEPARTMENT_OTHER): Payer: Self-pay | Admitting: *Deleted

## 2018-04-16 ENCOUNTER — Emergency Department (HOSPITAL_BASED_OUTPATIENT_CLINIC_OR_DEPARTMENT_OTHER)
Admission: EM | Admit: 2018-04-16 | Discharge: 2018-04-16 | Disposition: A | Discharge status: Home / Self Care | Payer: Self-pay | Attending: Emergency Medicine | Admitting: Emergency Medicine

## 2018-04-16 ENCOUNTER — Encounter (HOSPITAL_COMMUNITY): Payer: Self-pay | Admitting: *Deleted

## 2018-04-16 ENCOUNTER — Emergency Department (HOSPITAL_COMMUNITY)
Admission: EM | Admit: 2018-04-16 | Discharge: 2018-04-16 | Disposition: A | Discharge status: Home / Self Care | Payer: 59 | Source: Home / Self Care

## 2018-04-16 ENCOUNTER — Other Ambulatory Visit: Payer: Self-pay

## 2018-04-16 DIAGNOSIS — L0201 Cutaneous abscess of face: Secondary | ICD-10-CM | POA: Insufficient documentation

## 2018-04-16 DIAGNOSIS — L0291 Cutaneous abscess, unspecified: Secondary | ICD-10-CM

## 2018-04-16 DIAGNOSIS — Z87891 Personal history of nicotine dependence: Secondary | ICD-10-CM | POA: Insufficient documentation

## 2018-04-16 DIAGNOSIS — Z5321 Procedure and treatment not carried out due to patient leaving prior to being seen by health care provider: Secondary | ICD-10-CM | POA: Insufficient documentation

## 2018-04-16 DIAGNOSIS — L0211 Cutaneous abscess of neck: Secondary | ICD-10-CM | POA: Insufficient documentation

## 2018-04-16 DIAGNOSIS — Z79899 Other long term (current) drug therapy: Secondary | ICD-10-CM | POA: Insufficient documentation

## 2018-04-16 MED ORDER — DOXYCYCLINE HYCLATE 100 MG PO CAPS: 100.0000 mg | ORAL_CAPSULE | Freq: Two times a day (BID) | ORAL | 0 refills | Status: DC

## 2018-04-16 NOTE — ED Notes (Signed)
ED Provider at bedside. 

## 2018-04-16 NOTE — ED Notes (Signed)
Pt provided wait time per request; unwilling to wait; will return if needed
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
## 2018-04-16 NOTE — ED Triage Notes (Signed)
Pt reports abscess to chin x 1 day

## 2018-04-16 NOTE — Discharge Instructions (Addendum)
You were seen today for an abscess of the skin.  Use warm compresses 3-4 times a day to continue drainage.  You will be given antibiotics.  If you note increased swelling or redness you need to be reevaluated.

## 2018-04-16 NOTE — ED Provider Notes (Signed)
[facility] EMERGENCY DEPARTMENT Provider Note   CSN: [id number] Arrival date & time: 04/16/18  0400     History   Chief Complaint Chief Complaint  Patient presents with  . Abscess    HPI Seth Bird is a 42 y.o. male.  HPI  This is a 42 year old male who presents with swelling of the chin.  Patient reports 2 to 3-day history of worsening swelling under the chin.  Patient reports that he thinks the problem initially started when he dyed his beard a week or 2 ago.  He noted a skin rash and irritation.  Most of it healed except for underneath his chin.  Over the last 2 days he has noted one area of swelling and pain.  No obvious drainage.  Denies fevers.  Denies difficulty swallowing.  Denies significant pain.  History reviewed. No pertinent past medical history.  Patient Active Problem List   Diagnosis Date Noted  . De Quervain's tenosynovitis, right 07/22/2014  . Lower back injury 07/04/2014    History reviewed. No pertinent surgical history.      Home Medications    Prior to Admission medications   Medication Sig Start Date End Date Taking? Authorizing Provider  cyclobenzaprine (FLEXERIL) 10 MG tablet Take 1 tablet (10 mg total) by mouth at bedtime as needed for muscle spasms. 09/14/17   Robinson, [place] N, PA-C  doxycycline (VIBRAMYCIN) 100 MG capsule Take 1 capsule (100 mg total) by mouth 2 (two) times daily. 04/16/18   Krystal Delduca, Mayer Masker, MD  naproxen (NAPROSYN) 250 MG tablet Take 2 tablets (500 mg total) by mouth 2 (two) times daily with a meal. 09/14/17   Robinson, [place] N, PA-C    Family History No family history on file.  Social History Social History   Tobacco Use  . Smoking status: Former Smoker    Last attempt to quit: 06/12/2012    Years since quitting: 5.8  . Smokeless tobacco: Never Used  Substance Use Topics  . Alcohol use: No    Alcohol/week: 0.0 oz  . Drug use: No     Allergies   Patient has no known allergies.   Review of  Systems Review of Systems  Constitutional: Negative for fever.  HENT: Positive for facial swelling. Negative for trouble swallowing.   Skin: Negative for color change.       Abscess  All other systems reviewed and are negative.    Physical Exam Updated Vital Signs BP (!) 161/99 (BP Location: Left Arm)   Pulse 84   Temp 98.4 F (36.9 C) (Oral)   Resp 16   Ht  (2.032 m)   Wt 122.5 kg (270 lb)   SpO2 100%   BMI 29.66 kg/m   Physical Exam  Constitutional: He is oriented to person, place, and time. He appears well-developed and well-nourished. No distress.  HENT:  Head: Normocephalic and atraumatic.  Swelling noted in the left submental region, fluctuance and induration noted, approximately 3 x 3 cm, there is a purulent head without spontaneous drainage, no overlying skin changes or erythema  Eyes: Pupils are equal, round, and reactive to light.  Cardiovascular: Normal rate and regular rhythm.  Pulmonary/Chest: Effort normal. No respiratory distress.  Abdominal: Soft. There is no tenderness.  Neurological: He is alert and oriented to person, place, and time.  Skin: Skin is warm and dry.  Psychiatric: He has a normal mood and affect.  Nursing note and vitals reviewed.    ED Treatments / Results  Labs (all labs ordered are listed, but only abnormal results are displayed) Labs Reviewed - No data to display  EKG None  Radiology No results found.  Procedures .Marland KitchenIncision and Drainage Date/Time: 04/16/2018 4:49 AM Performed by: Shon Baton, MD Authorized by: Shon Baton, MD   Consent:    Consent obtained:  Verbal   Consent given by:  Patient   Risks discussed:  Pain, incomplete drainage and bleeding   Alternatives discussed:  No treatment Location:    Type:  Abscess   Size:  3x3 cm   Location:  Neck   Neck location:  L anterior Pre-procedure details:    Skin preparation:  Betadine Anesthesia (see MAR for exact dosages):    Anesthesia method:   None Procedure type:    Complexity:  Simple Procedure details:    Needle aspiration: no     Incision types:  Stab incision   Incision depth:  Dermal   Scalpel blade:  11   Drainage:  Purulent   Drainage amount:  Scant   Wound treatment:  Wound left open   Packing materials:  None Post-procedure details:    Patient tolerance of procedure:  Tolerated well, no immediate complications   (including critical care time)  Medications Ordered in ED Medications - No data to display   Initial Impression / Assessment and Plan / ED Course  I have reviewed the triage vital signs and the nursing notes.  Pertinent labs & imaging results that were available during my care of the patient were reviewed by me and considered in my medical decision making (see chart for details).     Patient presents with likely small abscess versus folliculitis over the chin.  He does shave in that area and recently had a neck reaction.  He is overall nontoxic-appearing and vital signs are reassuring.  No signs of airway compromise.  Given the relative small size and purulent head, this was unroofed with a stab incision.  Patient tolerated this well without anesthesia.  Some purulence was expressed.  Recommend warm compresses 3-4 times a day and antibiotics.  Patient stated understanding.  I advised him if he gets bigger or becomes red or more fluctuant, he may need a more involved I&D.  After history, exam, and medical workup I feel the patient has been appropriately medically screened and is safe for discharge home. Pertinent diagnoses were discussed with the patient. Patient was given return precautions.   Final Clinical Impressions(s) / ED Diagnoses   Final diagnoses:  Abscess    ED Discharge Orders        Ordered    doxycycline (VIBRAMYCIN) 100 MG capsule  2 times daily     04/16/18 0446       Madysen Faircloth, Mayer Masker, MD 04/16/18 [phone]

## 2018-04-16 NOTE — ED Triage Notes (Signed)
C/o abscess under chin x 2 days. States he put "some type of cream" on it that he was given for an "allergic reaction" ~ 2 wks ago. Also reports rash on chest and L arm. Denies other sx. Moderate swelling under chin area w/o drainage. Pt speaking clearly and maintaining secretions.

## 2018-07-30 ENCOUNTER — Encounter (HOSPITAL_BASED_OUTPATIENT_CLINIC_OR_DEPARTMENT_OTHER): Payer: Self-pay | Admitting: Emergency Medicine

## 2018-07-30 ENCOUNTER — Emergency Department (HOSPITAL_BASED_OUTPATIENT_CLINIC_OR_DEPARTMENT_OTHER)
Admission: EM | Admit: 2018-07-30 | Discharge: 2018-07-30 | Disposition: A | Discharge status: Home / Self Care | Payer: Self-pay | Attending: Emergency Medicine | Admitting: Emergency Medicine

## 2018-07-30 ENCOUNTER — Other Ambulatory Visit: Payer: Self-pay

## 2018-07-30 DIAGNOSIS — Z87891 Personal history of nicotine dependence: Secondary | ICD-10-CM | POA: Insufficient documentation

## 2018-07-30 DIAGNOSIS — R21 Rash and other nonspecific skin eruption: Secondary | ICD-10-CM | POA: Insufficient documentation

## 2018-07-30 MED ORDER — FLUOCINONIDE-E 0.05 % EX CREA: 1.0000 "application " | TOPICAL_CREAM | Freq: Two times a day (BID) | CUTANEOUS | 2 refills | Status: AC

## 2018-07-30 MED ORDER — FLUOCINONIDE-E 0.05 % EX CREA: 1.0000 "application " | TOPICAL_CREAM | Freq: Two times a day (BID) | CUTANEOUS | 2 refills | Status: DC

## 2018-07-30 NOTE — Discharge Instructions (Signed)
Please apply cream to the area twice a day until symptoms resolve.

## 2018-07-30 NOTE — ED Provider Notes (Addendum)
[facility] EMERGENCY DEPARTMENT Provider Note   CSN: [id number] Arrival date & time: 07/30/18  1124     History   Chief Complaint Chief Complaint  Patient presents with  . Rash    HPI Seth Bird is a 42 y.o. male.  42 y/o male with no PMH presents to the ED with a chief complaint of rash x 2 months.  States Seth Bird got his hair braided about 2 months ago and Seth Bird had a reaction to the chemical that the woman applied to his head.  States the rash began on his scalp region and now has migrated to his neck, ears, front of his chest.  Patient states the rash has worsened in the past week now migrating to his ears.  States wearing a hat for work makes this rash worse.  Seth Bird has tried cortisone cream, Caladryl, Neosporin which had had some relief in symptoms. Describes a rash as itchy not tender to palpation.  Denies any fever, discharge from his ears, chest pain or shortness of breath.     History reviewed. No pertinent past medical history.  Patient Active Problem List   Diagnosis Date Noted  . De Quervain's tenosynovitis, right 07/22/2014  . Lower back injury 07/04/2014    History reviewed. No pertinent surgical history.      Home Medications    Prior to Admission medications   Medication Sig Start Date End Date Taking? Authorizing Provider  cyclobenzaprine (FLEXERIL) 10 MG tablet Take 1 tablet (10 mg total) by mouth at bedtime as needed for muscle spasms. 09/14/17   Robinson, [place] N, PA-C  doxycycline (VIBRAMYCIN) 100 MG capsule Take 1 capsule (100 mg total) by mouth 2 (two) times daily. 04/16/18   Horton, Mayer Maskerourtney F, MD  fluocinonide-emollient (LIDEX-E) 0.05 % cream Apply 1 application topically 2 (two) times daily. Apply 1 application twice a day until symptoms resolve. 07/30/18   Claude MangesSoto, Jorgina Binning, PA-C  naproxen (NAPROSYN) 250 MG tablet Take 2 tablets (500 mg total) by mouth 2 (two) times daily with a meal. 09/14/17   Robinson, [place] N, PA-C    Family History No  family history on file.  Social History     Social History   Tobacco Use  . Smoking status: Former Smoker    Last attempt to quit: 06/12/2012    Years since quitting: 6.1  . Smokeless tobacco: Never Used  Substance Use Topics  . Alcohol use: No    Alcohol/week: 0.0 standard drinks  . Drug use: No     Allergies   Patient has no known allergies.   Review of Systems Review of Systems  Constitutional: Negative for chills and fever.  HENT: Negative for ear pain and sore throat.   Eyes: Negative for pain and visual disturbance.  Respiratory: Negative for cough and shortness of breath.   Cardiovascular: Negative for chest pain and palpitations.  Gastrointestinal: Negative for abdominal pain and vomiting.  Genitourinary: Negative for dysuria and hematuria.  Musculoskeletal: Negative for arthralgias and back pain.  Skin: Positive for rash. Negative for color change.  Neurological: Negative for seizures and syncope.  All other systems reviewed and are negative.    Physical Exam Updated Vital Signs BP 128/77 (BP Location: Left Arm)   Pulse (!) 54   Temp 98.4 F (36.9 C) (Oral)   Resp 18   Ht 6\' 8"  (2.032 m)   Wt 120.2 kg   SpO2 100%   BMI 29.11 kg/m   Physical Exam  Constitutional: Seth Bird is  oriented to person, place, and time. Seth Bird appears well-developed and well-nourished.  HENT:  Head: Normocephalic and atraumatic.  Mouth/Throat: Oropharynx is clear and moist.  Eyes: Pupils are equal, round, and reactive to light. No scleral icterus.  Neck: Normal range of motion.  Cardiovascular: Normal heart sounds.  Pulmonary/Chest: Effort normal and breath sounds normal. Seth Bird has no wheezes. Seth Bird exhibits no tenderness.  Abdominal: Soft. Bowel sounds are normal. Seth Bird exhibits no distension. There is no tenderness.  Musculoskeletal: Seth Bird exhibits no tenderness or deformity.  Neurological: Seth Bird is alert and oriented to person, place, and time.  Skin: Skin is warm and dry. Rash noted. Rash is  papular.     Well demarcated erythematous plaques and papules with silvery white scales present along his neck and chest region.   Nursing note and vitals reviewed.    ED Treatments / Results  Labs (all labs ordered are listed, but only abnormal results are displayed) Labs Reviewed - No data to display  EKG None  Radiology No results found.  Procedures Procedures (including critical care time)  Medications Ordered in ED Medications - No data to display   Initial Impression / Assessment and Plan / ED Course  I have reviewed the triage vital signs and the nursing notes.  Pertinent labs & imaging results that were available during my care of the patient were reviewed by me and considered in my medical decision making (see chart for details).     Patient present with thick plaques around neck and scalp region.There is no fever, or drainage from the rash. Rash has been present for 2 months.I have provided emollient cream for patient for the rash but advised to follow up with dermatology as this rash may need further treatment. Patient understand and agrees with plan. Return precautions provided.   Final Clinical Impressions(s) / ED Diagnoses   Final diagnoses:  Rash    ED Discharge Orders         Ordered    fluocinonide-emollient (LIDEX-E) 0.05 % cream  2 times daily     07/30/18 1218           Claude MangesSoto, Challis Crill, PA-C 07/30/18 1223    Claude MangesSoto, Milianna Ericsson, PA-C 07/30/18 1234    Loren RacerYelverton, David, MD 08/01/18 1726

## 2018-07-30 NOTE — ED Triage Notes (Signed)
Pt reports itchy rash to scalp, face, and next x 1 month.

## 2018-11-13 ENCOUNTER — Emergency Department (HOSPITAL_BASED_OUTPATIENT_CLINIC_OR_DEPARTMENT_OTHER): Payer: BLUE CROSS/BLUE SHIELD

## 2018-11-13 ENCOUNTER — Other Ambulatory Visit: Payer: Self-pay

## 2018-11-13 ENCOUNTER — Emergency Department (HOSPITAL_BASED_OUTPATIENT_CLINIC_OR_DEPARTMENT_OTHER)
Admission: EM | Admit: 2018-11-13 | Discharge: 2018-11-13 | Disposition: A | Discharge status: Home / Self Care | Payer: BLUE CROSS/BLUE SHIELD | Attending: Emergency Medicine | Admitting: Emergency Medicine

## 2018-11-13 ENCOUNTER — Encounter (HOSPITAL_BASED_OUTPATIENT_CLINIC_OR_DEPARTMENT_OTHER): Payer: Self-pay | Admitting: Emergency Medicine

## 2018-11-13 DIAGNOSIS — Y990 Civilian activity done for income or pay: Secondary | ICD-10-CM | POA: Diagnosis not present

## 2018-11-13 DIAGNOSIS — S63602A Unspecified sprain of left thumb, initial encounter: Secondary | ICD-10-CM

## 2018-11-13 DIAGNOSIS — W208XXA Other cause of strike by thrown, projected or falling object, initial encounter: Secondary | ICD-10-CM | POA: Diagnosis not present

## 2018-11-13 DIAGNOSIS — Z87891 Personal history of nicotine dependence: Secondary | ICD-10-CM | POA: Insufficient documentation

## 2018-11-13 DIAGNOSIS — Z79899 Other long term (current) drug therapy: Secondary | ICD-10-CM | POA: Diagnosis not present

## 2018-11-13 DIAGNOSIS — Y929 Unspecified place or not applicable: Secondary | ICD-10-CM | POA: Diagnosis not present

## 2018-11-13 DIAGNOSIS — S6992XA Unspecified injury of left wrist, hand and finger(s), initial encounter: Secondary | ICD-10-CM | POA: Diagnosis present

## 2018-11-13 DIAGNOSIS — Y9389 Activity, other specified: Secondary | ICD-10-CM | POA: Diagnosis not present

## 2018-11-13 NOTE — Discharge Instructions (Signed)
Please read and follow all provided instructions.  Your diagnoses today include:  1. Sprain of left thumb, unspecified site of finger, initial encounter     Tests performed today include:  An x-ray of the affected area - does NOT show any broken bones  Vital signs. See below for your results today.   Medications prescribed:  Please use over-the-counter NSAID medications (ibuprofen, naproxen) as directed on the packaging for pain.   Take any prescribed medications only as directed.  Home care instructions:   Follow any educational materials contained in this packet  Follow R.I.C.E. Protocol:  R - rest your injury   I  - use ice on injury without applying directly to skin  C - compress injury with bandage or splint  E - elevate the injury as much as possible  Follow-up instructions: Please follow-up with your primary care provider or the provided orthopedic physician (bone specialist) if you continue to have significant pain in 1 week. In this case you may have a more severe injury that requires further care.   Return instructions:   Please return if your fingers are numb or tingling, appear gray or blue, or you have severe pain (also elevate the arm and loosen splint or wrap if you were given one)  Please return to the Emergency Department if you experience worsening symptoms.   Please return if you have any other emergent concerns.  Additional Information:  Your vital signs today were: BP 134/65    Pulse 68    Temp 97.8 F (36.6 C) (Oral)    Resp 18    Ht 6\' 8"  (2.032 m)    Wt 120.2 kg    SpO2 97%    BMI 29.11 kg/m  If your blood pressure (BP) was elevated above 135/85 this visit, please have this repeated by your doctor within one month. --------------

## 2018-11-13 NOTE — ED Triage Notes (Signed)
Reports left hand injury at work today.  States box fell on his hand.

## 2018-11-13 NOTE — ED Provider Notes (Signed)
[facility] EMERGENCY DEPARTMENT Provider Note   CSN: [id number] Arrival date & time: 11/13/18  1417     History   Chief Complaint Chief Complaint  Patient presents with  . Hand Injury    HPI Seth Bird is a 42 y.o. male.  Patient presents with complaint of left thumb and wrist pain starting acutely today at work when he tried to catch a case of beer that was falling.  Patient was unloading a truck.  He had immediate pain.  He is uncertain as to how his hand and wrist actually moved at the time of injury. Pain radiates from the base of the thumb up into the wrist.  He denies any weakness.  He has generalized tingling in his fingertips but no numbness.  No treatments prior to arrival.  No other injuries.     History reviewed. No pertinent past medical history.  Patient Active Problem List   Diagnosis Date Noted  . De Quervain's tenosynovitis, right 07/22/2014  . Lower back injury 07/04/2014    History reviewed. No pertinent surgical history.      Home Medications    Prior to Admission medications   Medication Sig Start Date End Date Taking? Authorizing Provider  cyclobenzaprine (FLEXERIL) 10 MG tablet Take 1 tablet (10 mg total) by mouth at bedtime as needed for muscle spasms. 09/14/17   Robinson, [place] N, PA-C  doxycycline (VIBRAMYCIN) 100 MG capsule Take 1 capsule (100 mg total) by mouth 2 (two) times daily. 04/16/18   Horton, Mayer Masker, MD  fluocinonide-emollient (LIDEX-E) 0.05 % cream Apply 1 application topically 2 (two) times daily. Apply 1 application twice a day until symptoms resolve. 07/30/18   Claude Manges, PA-C  naproxen (NAPROSYN) 250 MG tablet Take 2 tablets (500 mg total) by mouth 2 (two) times daily with a meal. 09/14/17   Robinson, [place] N, PA-C    Family History History reviewed. No pertinent family history.  Social History Social History   Tobacco Use  . Smoking status: Former Smoker    Last attempt to quit: 06/12/2012    Years since  quitting: 6.4  . Smokeless tobacco: Never Used  Substance Use Topics  . Alcohol use: No    Alcohol/week: 0.0 standard drinks  . Drug use: No     Allergies   Patient has no known allergies.   Review of Systems Review of Systems  Constitutional: Negative for activity change.  Musculoskeletal: Positive for arthralgias. Negative for back pain, gait problem, joint swelling and neck pain.  Skin: Negative for wound.  Neurological: Negative for weakness and numbness.     Physical Exam Updated Vital Signs BP 134/65   Pulse 68   Temp 97.8 F (36.6 C) (Oral)   Resp 18   Ht 6\' 8"  (2.032 m)   Wt 120.2 kg   SpO2 97%   BMI 29.11 kg/m   Physical Exam  Constitutional: He appears well-developed and well-nourished.  HENT:  Head: Normocephalic and atraumatic.  Eyes: Conjunctivae are normal.  Neck: Normal range of motion. Neck supple.  Cardiovascular: Normal pulses. Exam reveals no decreased pulses.  Musculoskeletal: He exhibits tenderness. He exhibits no edema.       Left wrist: He exhibits tenderness. He exhibits normal range of motion and no bony tenderness.       Left hand: He exhibits tenderness. He exhibits normal range of motion and no bony tenderness.       Hands: Neurological: He is alert. No sensory deficit.  Motor,  sensation, and vascular distal to the injury is fully intact.   Skin: Skin is warm and dry.  Psychiatric: He has a normal mood and affect.  Nursing note and vitals reviewed.    ED Treatments / Results  Labs (all labs ordered are listed, but only abnormal results are displayed) Labs Reviewed - No data to display  EKG None  Radiology No results found.  Procedures Procedures (including critical care time)  Medications Ordered in ED Medications - No data to display   Initial Impression / Assessment and Plan / ED Course  I have reviewed the triage vital signs and the nursing notes.  Pertinent labs & imaging results that were available during my  care of the patient were reviewed by me and considered in my medical decision making (see chart for details).     Patient seen and examined. Work-up initiated.  Suspect sprain at this point.  Hand is neurovascularly intact.   Vital signs reviewed and are as follows: BP 134/65   Pulse 68   Temp 97.8 F (36.6 C) (Oral)   Resp 18   Ht 6\' 8"  (2.032 m)   Wt 120.2 kg   SpO2 97%   BMI 29.11 kg/m   3:50 PM patient updated on results including osteopenia.  Encourage PCP follow-up for this.  Sports medicine referral given.  Patient placed in a Velcro thumb spica splint.  Encouraged rice protocol, follow-up if not improved in 1 week.  Final Clinical Impressions(s) / ED Diagnoses   Final diagnoses:  Sprain of left thumb, unspecified site of finger, initial encounter   Patient with left thumb and wrist sprain after injury today.  Treatment as above.  Upper extremity is neurovascularly intact.  Imaging is negative for acute fracture.  ED Discharge Orders    None       Renne CriglerGeiple, Moriyah Byington, PA-C 11/13/18 1550    Virgina NorfolkCuratolo, Adam, DO 11/13/18 1844

## 2018-11-29 ENCOUNTER — Ambulatory Visit: Payer: BLUE CROSS/BLUE SHIELD | Admitting: Family Medicine

## 2019-05-10 ENCOUNTER — Emergency Department (HOSPITAL_BASED_OUTPATIENT_CLINIC_OR_DEPARTMENT_OTHER)
Admission: EM | Admit: 2019-05-10 | Discharge: 2019-05-10 | Disposition: A | Discharge status: Home / Self Care | Payer: Self-pay | Attending: Emergency Medicine | Admitting: Emergency Medicine

## 2019-05-10 ENCOUNTER — Encounter (HOSPITAL_BASED_OUTPATIENT_CLINIC_OR_DEPARTMENT_OTHER): Payer: Self-pay | Admitting: *Deleted

## 2019-05-10 ENCOUNTER — Emergency Department (HOSPITAL_BASED_OUTPATIENT_CLINIC_OR_DEPARTMENT_OTHER): Payer: Self-pay

## 2019-05-10 ENCOUNTER — Other Ambulatory Visit: Payer: Self-pay

## 2019-05-10 DIAGNOSIS — Y93E6 Activity, residential relocation: Secondary | ICD-10-CM | POA: Insufficient documentation

## 2019-05-10 DIAGNOSIS — X503XXA Overexertion from repetitive movements, initial encounter: Secondary | ICD-10-CM | POA: Insufficient documentation

## 2019-05-10 DIAGNOSIS — Z87891 Personal history of nicotine dependence: Secondary | ICD-10-CM | POA: Insufficient documentation

## 2019-05-10 DIAGNOSIS — Y999 Unspecified external cause status: Secondary | ICD-10-CM | POA: Insufficient documentation

## 2019-05-10 DIAGNOSIS — Z79899 Other long term (current) drug therapy: Secondary | ICD-10-CM | POA: Insufficient documentation

## 2019-05-10 DIAGNOSIS — Y929 Unspecified place or not applicable: Secondary | ICD-10-CM | POA: Insufficient documentation

## 2019-05-10 DIAGNOSIS — S63502A Unspecified sprain of left wrist, initial encounter: Secondary | ICD-10-CM | POA: Insufficient documentation

## 2019-05-10 MED ORDER — IBUPROFEN 600 MG PO TABS: 600.0000 mg | ORAL_TABLET | Freq: Four times a day (QID) | ORAL | 0 refills | Status: DC | PRN

## 2019-05-10 NOTE — ED Provider Notes (Signed)
[facility] EMERGENCY DEPARTMENT Provider Note   CSN: [id number] Arrival date & time: 05/10/19  1423    History   Chief Complaint Chief Complaint  Patient presents with  . Wrist Pain    HPI Nadene RubinsJames D Narine is a 43 y.o. male.     The history is provided by the patient and medical records. No language interpreter was used.  Wrist Pain    Nadene RubinsJames D Kozuch is a left hand dominant 43 y.o. male  with a PMH as listed below who presents to the Emergency Department complaining of acute onset of left wrist pain.  Patient states that he was helping a friend move furniture after work today when the piece of furniture slipped and he caught it.  It was a lot of force on his wrist and hand getting this heavy piece of furniture suddenly like that.  He felt a pop to his wrist.  Pain is worse with movement of the wrist as well as moving the thumb, however he is able to do so.  He has not taken any medications prior to arrival for symptoms.  No numbness or weakness.  No cuts / wounds.   History reviewed. No pertinent past medical history.  Patient Active Problem List   Diagnosis Date Noted  . De Quervain's tenosynovitis, right 07/22/2014  . Lower back injury 07/04/2014    History reviewed. No pertinent surgical history.      Home Medications    Prior to Admission medications   Medication Sig Start Date End Date Taking? Authorizing Provider  cyclobenzaprine (FLEXERIL) 10 MG tablet Take 1 tablet (10 mg total) by mouth at bedtime as needed for muscle spasms. 09/14/17   Robinson, [place]il[place]Jordan N, PA-C  doxycycline (VIBRAMYCIN) 100 MG capsule Take 1 capsule (100 mg total) by mouth 2 (two) times daily. 04/16/18   Horton, Mayer Maskerourtney F, MD  fluocinonide-emollient (LIDEX-E) 0.05 % cream Apply 1 application topically 2 (two) times daily. Apply 1 application twice a day until symptoms resolve. 07/30/18   Claude MangesSoto, Johana, PA-C  ibuprofen (ADVIL) 600 MG tablet Take 1 tablet (600 mg total) by mouth every 6 (six)  hours as needed for mild pain or moderate pain. 05/10/19   , Chase PicketJaime Pilcher, PA-C  naproxen (NAPROSYN) 250 MG tablet Take 2 tablets (500 mg total) by mouth 2 (two) times daily with a meal. 09/14/17   Robinson, [place]il[place]Jordan N, PA-C    Family History No family history on file.  Social History Social History   Tobacco Use  . Smoking status: Former Smoker    Last attempt to quit: 06/12/2012    Years since quitting: 6.9  . Smokeless tobacco: Never Used  Substance Use Topics  . Alcohol use: No    Alcohol/week: 0.0 standard drinks  . Drug use: No     Allergies   Patient has no known allergies.   Review of Systems Review of Systems  Musculoskeletal: Positive for arthralgias and myalgias. Negative for joint swelling.  Skin: Negative for color change and wound.  Neurological: Negative for weakness and numbness.     Physical Exam Updated Vital Signs BP 136/78   Pulse 81   Temp 98.7 F (37.1 C) (Oral)   Resp 16   Ht 6\' 8"  (2.032 m)   Wt 122.9 kg   SpO2 99%   BMI 29.77 kg/m   Physical Exam Vitals signs and nursing note reviewed.  Constitutional:      General: He is not in acute distress.  Appearance: He is well-developed.  HENT:     Head: Normocephalic and atraumatic.  Neck:     Musculoskeletal: Neck supple.  Cardiovascular:     Rate and Rhythm: Normal rate and regular rhythm.     Heart sounds: Normal heart sounds. No murmur.  Pulmonary:     Effort: Pulmonary effort is normal. No respiratory distress.     Breath sounds: Normal breath sounds. No wheezing or rales.  Musculoskeletal:     Comments: Tenderness to palpation to the radial aspect of the wrist and up the dorsal aspect of the left thumb.  No focal tenderness to the anatomical snuffbox.  He has a strong pincer grasp and grip strength.  He is able to keep thumb up against resistance and cross the fingers fine.  Sensation is intact to radial, ulnar and median nerve distributions.  Good cap refill and 2+ radial pulse.   No swelling evident.  Skin:    General: Skin is warm and dry.  Neurological:     Mental Status: He is alert.      ED Treatments / Results  Labs (all labs ordered are listed, but only abnormal results are displayed) Labs Reviewed - No data to display  EKG None  Radiology Dg Wrist Complete Left  Result Date: 05/10/2019 CLINICAL DATA:  Pain after catching piece of falling furniture EXAM: LEFT WRIST - COMPLETE 3+ VIEW COMPARISON:  Left hand November 13, 2018 FINDINGS: Frontal, oblique, lateral, and ulnar deviation scaphoid images were obtained. There is no fracture or dislocation. Joint spaces appear normal. No erosive change. IMPRESSION: No fracture or dislocation.  No evident arthropathy. Electronically Signed   By: Bretta Bang III M.D.   On: 05/10/2019 15:25    Procedures Procedures (including critical care time)  Medications Ordered in ED Medications - No data to display   Initial Impression / Assessment and Plan / ED Course  I have reviewed the triage vital signs and the nursing notes.  Pertinent labs & imaging results that were available during my care of the patient were reviewed by me and considered in my medical decision making (see chart for details).       CAMILLE SHILLITO is a 43 y.o. male who presents to ED for acute onset of left wrist and thumb pain while moving furniture today.  He is neurovascularly intact on exam with no focal tenderness to the anatomical snuffbox.  He does have tenderness to the radial aspect of the wrist and up the dorsum of the thumb along tendon.  He has great grip strength of pincer grasp.  No muscle weakness at all.  X-ray obtained and negative.  Will place him in splint.  Sports medicine follow-up if symptoms are not improving with supportive care.  Reasons to return to ER were discussed and all questions were answered.   Final Clinical Impressions(s) / ED Diagnoses   Final diagnoses:  Sprain of left wrist, initial encounter     ED Discharge Orders         Ordered    ibuprofen (ADVIL) 600 MG tablet  Every 6 hours PRN     05/10/19 1617           , Chase Picket, PA-C 05/10/19 1626    Arby Barrette, MD 05/25/19 1434

## 2019-05-10 NOTE — ED Triage Notes (Signed)
Left wrist for an hour. Wrist twisted while helping a friend move furniture.

## 2019-05-10 NOTE — Discharge Instructions (Signed)
It was my pleasure taking care of you today!   Ibuprofen as needed for pain.   Ice to the area will help with swelling as well as pain.   If you are not feeling better in 1 week, I would encourage you to follow up with the sports medicine clinic (information listed).   Return to the emergency department for new or worsening symptoms, any additional concerns.

## 2019-05-21 ENCOUNTER — Ambulatory Visit (INDEPENDENT_AMBULATORY_CARE_PROVIDER_SITE_OTHER): Payer: Self-pay | Admitting: Family Medicine

## 2019-05-21 ENCOUNTER — Encounter: Payer: Self-pay | Admitting: Family Medicine

## 2019-05-21 ENCOUNTER — Other Ambulatory Visit: Payer: Self-pay

## 2019-05-21 VITALS — BP 141/90 | HR 69 | Ht >= 80 in | Wt 270.0 lb

## 2019-05-21 DIAGNOSIS — M25532 Pain in left wrist: Secondary | ICD-10-CM

## 2019-05-21 MED ORDER — DICLOFENAC SODIUM 75 MG PO TBEC: 75.0000 mg | DELAYED_RELEASE_TABLET | Freq: Two times a day (BID) | ORAL | 1 refills | Status: DC

## 2019-05-21 NOTE — Progress Notes (Signed)
PCP: System, Pcp Not In  Subjective:   HPI: Patient is a 43 y.o. male here for left wrist pain.  Patient presents with radial sided left wrist pain for about 11 days.  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 He was seen in the ED, xrays done and he was discharged with volar wrist splint and ibuprofen. Today he continues to have 8/10 pain with movement of his thumb. He notes to relief with his brace or the ibuprofen. He reports feelling of swelling along the radial wrist. No erythema or bruising. No numbness or tingling.  No past medical history on file.  Current Outpatient Medications on File Prior to Visit  Medication Sig Dispense Refill  . cyclobenzaprine (FLEXERIL) 10 MG tablet Take 1 tablet (10 mg total) by mouth at bedtime as needed for muscle spasms. 10 tablet 0  . doxycycline (VIBRAMYCIN) 100 MG capsule Take 1 capsule (100 mg total) by mouth 2 (two) times daily. 20 capsule 0  . fluocinonide-emollient (LIDEX-E) 0.05 % cream Apply 1 application topically 2 (two) times daily. Apply 1 application twice a day until symptoms resolve. 30 g 2  . ibuprofen (ADVIL) 600 MG tablet Take 1 tablet (600 mg total) by mouth every 6 (six) hours as needed for mild pain or moderate pain. 30 tablet 0  . naproxen (NAPROSYN) 250 MG tablet Take 2 tablets (500 mg total) by mouth 2 (two) times daily with a meal. 30 tablet 0   No current facility-administered medications on file prior to visit.     No past surgical history on file.  No Known Allergies  Social History   Socioeconomic History  . Marital status: Single    Spouse name: Not on file  . Number of children: Not on file  . Years of education: Not on file  . Highest education level: Not on file  Occupational History  . Not on file  Social Needs  . Financial resource strain: Not on file  . Food insecurity:    Worry: Not on file    Inability: Not on file  . Transportation  needs:    Medical: Not on file    Non-medical: Not on file  Tobacco Use  . Smoking status: Former Smoker    Last attempt to quit: 06/12/2012    Years since quitting: 6.9  . Smokeless tobacco: Never Used  Substance and Sexual Activity  . Alcohol use: No    Alcohol/week: 0.0 standard drinks  . Drug use: No  . Sexual activity: Not on file  Lifestyle  . Physical activity:    Days per week: Not on file    Minutes per session: Not on file  . Stress: Not on file  Relationships  . Social connections:    Talks on phone: Not on file    Gets together: Not on file    Attends religious service: Not on file    Active member of club or organization: Not on file    Attends meetings of clubs or organizations: Not on file    Relationship status: Not on file  . Intimate partner violence:    Fear of current or ex partner: Not on file    Emotionally abused: Not on file    Physically abused: Not on file    Forced sexual activity: Not on file  Other Topics Concern  . Not on file  Social History Narrative  .  Not on file    No family history on file.  BP (!) 141/90   Pulse 69   Ht 6\' 8"  (2.032 m)   Wt 270 lb (122.5 kg)   BMI 29.66 kg/m   Review of Systems: See HPI above.     Objective:  Physical Exam:  Gen: awake, alert, NAD, comfortable in exam room. Pulm: breathing unlabored.  Left Wrist/Hand: Inspection: No obvious deformity. No swelling, erythema or bruising Palpation: Tenderness over the radial wrist along the first dorsal compartment. There is slight crepitus noted with palpation of 1st dorsal compartment ROM: Limited flexion extension of the thumb due to pain/guarding Strength: 5/5 strength in the forearm, wrist, and interosseus muscles.  Significant pain reproduced with resisted thumb extension Neurovascular: NV intact Special tests: Positive Finkelstein's  MSK US: Ultrasound of the left hand performed.  There is evidence of tenosynovitis of the first dorsal compartment.  There is a small split tear seen within EPB. On longitudinal view, there is significant thickening of the tendons. Tendons intact distally to insertion. No evidence of scaphoid fracture  Right wrist/hand: No deformity or swelling Full ROM with 5/5 strength NV intact distally   Assessment & Plan:  1. Left dequervains tenosynovitis - xrays from the ED were independently reviewed and show no acute or structural abnormalities. Discussed various treatment options today - Diclofenac BID - thumb spica - no work x6 weeks or until cleared - pt will return in 6 weeks or sooner if he wants to try a cortisone shot.

## 2019-05-21 NOTE — Patient Instructions (Signed)
You have deQuervain's tenosynovitis of your thumb/wrist. Avoid painful activities as much as possible. Wear the thumb spica brace as often as possible to rest this. Ice 15 minutes at a time 3-4 times a day. Diclofenac 75mg  twice a day with food for pain and inflammation A cortisone injection typically helps a great deal with this and is an option. Physical therapy for iontophoresis is another consideration. Follow up with me in 6 weeks for reevaluation.

## 2019-05-22 ENCOUNTER — Telehealth: Payer: Self-pay | Admitting: Family Medicine

## 2019-05-22 ENCOUNTER — Encounter: Payer: Self-pay | Admitting: Family Medicine

## 2019-05-22 NOTE — Telephone Encounter (Signed)
Patient states he woke up this morning with pain in his wrist and thumb that prevented him from going to work. He is asking for a note to excuse him from work today.

## 2019-05-22 NOTE — Telephone Encounter (Signed)
Letter written for today and return tomorrow.

## 2019-05-22 NOTE — Telephone Encounter (Signed)
Ok to provide one day note for today.  He should be able to return tomorrow with the light duty restrictions.

## 2019-05-28 ENCOUNTER — Other Ambulatory Visit: Payer: Self-pay

## 2019-05-28 ENCOUNTER — Ambulatory Visit (INDEPENDENT_AMBULATORY_CARE_PROVIDER_SITE_OTHER): Payer: Self-pay | Admitting: Family Medicine

## 2019-05-28 ENCOUNTER — Encounter: Payer: Self-pay | Admitting: Family Medicine

## 2019-05-28 VITALS — BP 132/74 | HR 59 | Ht >= 80 in | Wt 270.0 lb

## 2019-05-28 DIAGNOSIS — M25532 Pain in left wrist: Secondary | ICD-10-CM

## 2019-05-28 MED ORDER — METHYLPREDNISOLONE ACETATE 40 MG/ML IJ SUSP
20.0000 mg | Freq: Once | INTRAMUSCULAR | Status: AC
  Administered 2019-05-28: 12:00:00 20 mg via INTRA_ARTICULAR

## 2019-05-28 NOTE — Progress Notes (Signed)
PCP: System, Pcp Not In  Subjective:   HPI: Patient is a 43 y.o. male here for left wrist pain.  6/8: Patient presents with radial sided left wrist pain for about 11 days.  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 He was seen in the ED, xrays done and he was discharged with volar wrist splint and ibuprofen. Today he continues to have 8/10 pain with movement of his thumb. He notes to relief with his brace or the ibuprofen. He reports feelling of swelling along the radial wrist. No erythema or bruising. No numbness or tingling.  6/15: Pt returns today for steroid injection for dequervains tenosynovitis. He continues to wear his wrist brace.  No past medical history on file.  Current Outpatient Medications on File Prior to Visit  Medication Sig Dispense Refill  . cyclobenzaprine (FLEXERIL) 10 MG tablet Take 1 tablet (10 mg total) by mouth at bedtime as needed for muscle spasms. 10 tablet 0  . diclofenac (VOLTAREN) 75 MG EC tablet Take 1 tablet (75 mg total) by mouth 2 (two) times daily. 60 tablet 1  . doxycycline (VIBRAMYCIN) 100 MG capsule Take 1 capsule (100 mg total) by mouth 2 (two) times daily. 20 capsule 0  . fluocinonide-emollient (LIDEX-E) 0.05 % cream Apply 1 application topically 2 (two) times daily. Apply 1 application twice a day until symptoms resolve. 30 g 2  . ibuprofen (ADVIL) 600 MG tablet Take 1 tablet (600 mg total) by mouth every 6 (six) hours as needed for mild pain or moderate pain. 30 tablet 0  . naproxen (NAPROSYN) 250 MG tablet Take 2 tablets (500 mg total) by mouth 2 (two) times daily with a meal. 30 tablet 0   No current facility-administered medications on file prior to visit.     No past surgical history on file.  No Known Allergies  Social History   Socioeconomic History  . Marital status: Single    Spouse name: Not on file  . Number of children: Not on file  . Years of education: Not on  file  . Highest education level: Not on file  Occupational History  . Not on file  Social Needs  . Financial resource strain: Not on file  . Food insecurity    Worry: Not on file    Inability: Not on file  . Transportation needs    Medical: Not on file    Non-medical: Not on file  Tobacco Use  . Smoking status: Former Smoker    Quit date: 06/12/2012    Years since quitting: 6.9  . Smokeless tobacco: Never Used  Substance and Sexual Activity  . Alcohol use: No    Alcohol/week: 0.0 standard drinks  . Drug use: No  . Sexual activity: Not on file  Lifestyle  . Physical activity    Days per week: Not on file    Minutes per session: Not on file  . Stress: Not on file  Relationships  . Social Herbalist on phone: Not on file    Gets together: Not on file    Attends religious service: Not on file    Active member of club or organization: Not on file    Attends meetings of clubs or organizations: Not on file    Relationship status: Not on file  . Intimate partner violence    Fear of current or ex partner: Not on file  Emotionally abused: Not on file    Physically abused: Not on file    Forced sexual activity: Not on file  Other Topics Concern  . Not on file  Social History Narrative  . Not on file    No family history on file.  BP 132/74   Pulse (!) 59   Ht 6\' 8"  (2.032 m)   Wt 270 lb (122.5 kg)   BMI 29.66 kg/m   Review of Systems: See HPI above.     Objective:  Physical Exam:  Procedure performed: Left wrist First Dorsal compartment steroid injection , US guided Consent obtained and verified. Time-out conducted. Noted no overlying erythema, induration, or other signs of local infection. The Left first dorsal compartment was identified with ultrasound and marked.  The overlying skin was prepped in a sterile fashion. Topical analgesic spray: Ethyl chloride. Needle: 25-gauge, 1.5 inch Using an out of plane approach the needle was advanced into the  tendon sheath and medications were injected.  Completed without difficulty. Meds: depomedrol 20mg , bupivicaine 0.5cc  Assessment & Plan:  1. Left dequervain's tenosynovitis - steroid injection performed today as above. Pt tolerated well and reported immediate improvement with the anesthetic effect.

## 2019-06-18 ENCOUNTER — Ambulatory Visit: Payer: Self-pay | Admitting: Family Medicine

## 2019-06-20 ENCOUNTER — Ambulatory Visit: Payer: Self-pay | Admitting: Family Medicine

## 2019-07-13 ENCOUNTER — Ambulatory Visit (INDEPENDENT_AMBULATORY_CARE_PROVIDER_SITE_OTHER): Payer: PRIVATE HEALTH INSURANCE | Admitting: Family Medicine

## 2019-07-13 ENCOUNTER — Other Ambulatory Visit: Payer: Self-pay

## 2019-07-13 VITALS — BP 128/80 | Ht >= 80 in | Wt 270.0 lb

## 2019-07-13 DIAGNOSIS — M25532 Pain in left wrist: Secondary | ICD-10-CM | POA: Diagnosis not present

## 2019-07-13 MED ORDER — MELOXICAM 15 MG PO TABS: 15.0000 mg | ORAL_TABLET | Freq: Every day | ORAL | 2 refills | Status: DC

## 2019-07-13 NOTE — Patient Instructions (Signed)
Take meloxicam 15mg  daily with food for pain and inflammation. Don't take ibuprofen, naproxen, or diclofenac while on this. You can take tylenol though and can also use topical medicines like biofreeze. Wear the thumb spica brace as much as possible. Consider repeat injection, physical therapy if not improving - just give me a call if you want to do either of these. Follow up with me in 6 weeks otherwise.

## 2019-07-16 ENCOUNTER — Encounter: Payer: Self-pay | Admitting: Family Medicine

## 2019-07-16 NOTE — Progress Notes (Signed)
PCP: System, Pcp Not In  Subjective:   HPI: Patient is a 43 y.o. male here for left wrist pain.  6/8: Patient presents with radial sided left wrist pain for about 11 days.  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 He was seen in the ED, xrays done and he was discharged with volar wrist splint and ibuprofen. Today he continues to have 8/10 pain with movement of his thumb. He notes to relief with his brace or the ibuprofen. He reports feelling of swelling along the radial wrist. No erythema or bruising. No numbness or tingling.  6/15: Pt returns today for steroid injection for dequervains tenosynovitis. He continues to wear his wrist brace.  7/31: Patient reports he did well after injection but feels he overdid it since then and pain has recurred. Pain on radial aspect of wrist. Wearing wrist brace. Has been icing but not taking any medicines for this. No numbness, skin changes.  History reviewed. No pertinent past medical history.  Current Outpatient Medications on File Prior to Visit  Medication Sig Dispense Refill  . doxycycline (VIBRAMYCIN) 100 MG capsule Take 1 capsule (100 mg total) by mouth 2 (two) times daily. 20 capsule 0  . fluocinonide-emollient (LIDEX-E) 0.05 % cream Apply 1 application topically 2 (two) times daily. Apply 1 application twice a day until symptoms resolve. 30 g 2   No current facility-administered medications on file prior to visit.     History reviewed. No pertinent surgical history.  No Known Allergies  Social History   Socioeconomic History  . Marital status: Single    Spouse name: Not on file  . Number of children: Not on file  . Years of education: Not on file  . Highest education level: Not on file  Occupational History  . Not on file  Social Needs  . Financial resource strain: Not on file  . Food insecurity    Worry: Not on file    Inability: Not on file  .  Transportation needs    Medical: Not on file    Non-medical: Not on file  Tobacco Use  . Smoking status: Former Smoker    Quit date: 06/12/2012    Years since quitting: 7.0  . Smokeless tobacco: Never Used  Substance and Sexual Activity  . Alcohol use: No    Alcohol/week: 0.0 standard drinks  . Drug use: No  . Sexual activity: Not on file  Lifestyle  . Physical activity    Days per week: Not on file    Minutes per session: Not on file  . Stress: Not on file  Relationships  . Social Herbalist on phone: Not on file    Gets together: Not on file    Attends religious service: Not on file    Active member of club or organization: Not on file    Attends meetings of clubs or organizations: Not on file    Relationship status: Not on file  . Intimate partner violence    Fear of current or ex partner: Not on file    Emotionally abused: Not on file    Physically abused: Not on file    Forced sexual activity: Not on file  Other Topics Concern  . Not on file  Social History Narrative  . Not on file    History reviewed. No pertinent family history.  BP 128/80   Ht 6'  8" (2.032 m)   Wt 270 lb (122.5 kg)   BMI 29.66 kg/m   Review of Systems: See HPI above.     Objective:  Gen: NAD, comfortable in exam room  Left wrist/hand: No deformity. FROM with 5/5 strength - pain thumb abduction and extension. TTP 1st dorsal compartment. NVI distally. Negative tinels. Positive finkelsteins.  Assessment & Plan:  1. Left dequervain's tenosynovitis - injection helped but pain has recurred with activity.  Discussed repeat injection vs conservative treatment - opted for meloxicam with thumb spica brace but will consider repeat injection, physical therapy if not improving.  F/u in 6 weeks.

## 2019-08-24 ENCOUNTER — Ambulatory Visit: Payer: PRIVATE HEALTH INSURANCE | Admitting: Family Medicine

## 2022-11-01 ENCOUNTER — Other Ambulatory Visit: Payer: Self-pay

## 2022-11-01 ENCOUNTER — Emergency Department (HOSPITAL_BASED_OUTPATIENT_CLINIC_OR_DEPARTMENT_OTHER)
Admission: EM | Admit: 2022-11-01 | Discharge: 2022-11-01 | Disposition: A | Discharge status: Home / Self Care | Payer: BC Managed Care – PPO | Attending: Emergency Medicine | Admitting: Emergency Medicine

## 2022-11-01 DIAGNOSIS — K0889 Other specified disorders of teeth and supporting structures: Secondary | ICD-10-CM

## 2022-11-01 DIAGNOSIS — K0381 Cracked tooth: Secondary | ICD-10-CM | POA: Insufficient documentation

## 2022-11-01 DIAGNOSIS — K029 Dental caries, unspecified: Secondary | ICD-10-CM | POA: Diagnosis not present

## 2022-11-01 MED ORDER — PENICILLIN V POTASSIUM 500 MG PO TABS: 500.0000 mg | ORAL_TABLET | Freq: Four times a day (QID) | ORAL | 0 refills | Status: AC

## 2022-11-01 MED ORDER — MELOXICAM 15 MG PO TABS: 15.0000 mg | ORAL_TABLET | Freq: Every day | ORAL | 0 refills | Status: AC

## 2022-11-01 NOTE — Discharge Instructions (Signed)
You were seen in the emergency department for dental pain.  As we discussed I think your pain is likely related to a cavity in your left upper molar. We normally treat this with anti-inflammatories and antibiotics.   I've attached a resource guide with several dentists in the area. It's incredibly important you follow up with a dentist as soon as possible for definitive treatment.   Continue to monitor how you're doing and return to the ER for new or worsening symptoms such as difficulty swallowing your own saliva, difficulty breathing, or fever.

## 2022-11-01 NOTE — ED Provider Notes (Signed)
[facility] EMERGENCY DEPARTMENT Provider Note   CSN: [id number] Arrival date & time: 11/01/22  1037     History  Chief Complaint  Patient presents with   Dental Problem    Seth Bird is a 46 y.o. male.  Patient with noncontributory past medical history presents today with complaints of dental pain. He states that same began Saturday when he chipped a tooth. Pain is located on the left upper molar. Denies fevers, chills. No trouble swallowing. Does endorse some facial swelling. Has not scheduled an appointment with a dentist. No hx of diabetes.  The history is provided by the patient. No language interpreter was used.       Home Medications Prior to Admission medications   Medication Sig Start Date End Date Taking? Authorizing Provider  doxycycline (VIBRAMYCIN) 100 MG capsule Take 1 capsule (100 mg total) by mouth 2 (two) times daily. 04/16/18   Horton, Mayer Masker, MD  fluocinonide-emollient (LIDEX-E) 0.05 % cream Apply 1 application topically 2 (two) times daily. Apply 1 application twice a day until symptoms resolve. 07/30/18   Claude Manges, PA-C  meloxicam (MOBIC) 15 MG tablet Take 1 tablet (15 mg total) by mouth daily. 07/13/19   Lenda Kelp, MD      Allergies    Patient has no known allergies.    Review of Systems   Review of Systems  HENT:  Positive for dental problem.   All other systems reviewed and are negative.   Physical Exam Updated Vital Signs BP (!) 157/101   Pulse 83   Temp 98.2 F (36.8 C) (Oral)   Resp 17   SpO2 98%  Physical Exam Vitals and nursing note reviewed.  Constitutional:      General: He is not in acute distress.    Appearance: Normal appearance. He is normal weight. He is not ill-appearing, toxic-appearing or diaphoretic.  HENT:     Head: Normocephalic and atraumatic.     Mouth/Throat:     Mouth: Mucous membranes are moist.      Comments: Dentition poor throughout, Mostly absent left upper molar, only small amount  of chipped tooth remaining and is clearly decayed. No swelling under the tongue, no concern for Ludwigs angina. Trace left sided facial swelling. No trismus or crepitus.  Cardiovascular:     Rate and Rhythm: Normal rate.  Pulmonary:     Effort: Pulmonary effort is normal. No respiratory distress.  Musculoskeletal:        General: Normal range of motion.     Cervical back: Normal range of motion.  Skin:    General: Skin is warm and dry.  Neurological:     General: No focal deficit present.     Mental Status: He is alert.  Psychiatric:        Mood and Affect: Mood normal.        Behavior: Behavior normal.     ED Results / Procedures / Treatments   Labs (all labs ordered are listed, but only abnormal results are displayed) Labs Reviewed - No data to display  EKG None  Radiology No results found.  Procedures Procedures    Medications Ordered in ED Medications - No data to display  ED Course/ Medical Decision Making/ A&P                           Medical Decision Making  Patient presents today with complaints of dental pain.  He is afebrile,  nontoxic-appearing, and in no acute distress with reassuring vital signs.  Physical exam reveals poor dentition with a chipped and decayed left upper molar.  Trace amount of facial swelling without gross abscess.  Exam unconcerning for Ludwig's angina or spread of infection.  Will treat with penicillin and anti-inflammatories medicine.  Urged patient to follow-up with dentist, given resources for same.  Patient understanding and amenable with plan, educated on red flag symptoms that would prompt immediate return.  Patient discharged in stable condition.   Final Clinical Impression(s) / ED Diagnoses Final diagnoses:  Pain, dental    Rx / DC Orders ED Discharge Orders          Ordered    penicillin v potassium (VEETID) 500 MG tablet  4 times daily        11/01/22 1206    meloxicam (MOBIC) 15 MG tablet  Daily        11/01/22 1207           An After Visit Summary was printed and given to the patient.     Nestor Lewandowsky 11/01/22 1207    Hayden Rasmussen, MD 11/02/22 [phone]

## 2022-11-01 NOTE — ED Notes (Signed)
Reviewed discharge instructions, recommendations, and medications reviewed with pt. Dental referral given to patient. Pts questions answered and states understanding. Ambulatory for discharge

## 2022-11-01 NOTE — ED Triage Notes (Signed)
Patient presents to ED via POV from home. Here with left upper dental pain since Saturday. Reports known "bad teeth" to area.
